# Patient Record
Sex: Male | Born: 1940
Health system: Southern US, Community
[De-identification: ages and names within clinical notes are randomized; demographics above are authoritative.]

## PROBLEM LIST (undated history)

## (undated) DIAGNOSIS — Z973 Presence of spectacles and contact lenses: Secondary | ICD-10-CM

## (undated) DIAGNOSIS — N400 Enlarged prostate without lower urinary tract symptoms: Secondary | ICD-10-CM

## (undated) DIAGNOSIS — I4821 Permanent atrial fibrillation: Secondary | ICD-10-CM

## (undated) DIAGNOSIS — E785 Hyperlipidemia, unspecified: Secondary | ICD-10-CM

## (undated) DIAGNOSIS — H9319 Tinnitus, unspecified ear: Secondary | ICD-10-CM

## (undated) DIAGNOSIS — Z9889 Other specified postprocedural states: Secondary | ICD-10-CM

## (undated) DIAGNOSIS — I480 Paroxysmal atrial fibrillation: Secondary | ICD-10-CM

## (undated) DIAGNOSIS — I10 Essential (primary) hypertension: Secondary | ICD-10-CM

## (undated) DIAGNOSIS — R112 Nausea with vomiting, unspecified: Secondary | ICD-10-CM

## (undated) DIAGNOSIS — E039 Hypothyroidism, unspecified: Secondary | ICD-10-CM

## (undated) DIAGNOSIS — E042 Nontoxic multinodular goiter: Secondary | ICD-10-CM

## (undated) HISTORY — PX: COLONOSCOPY: SHX174

## (undated) HISTORY — DX: Tinnitus, unspecified ear: H93.19

## (undated) HISTORY — DX: Nontoxic multinodular goiter: E04.2

## (undated) HISTORY — DX: Hypothyroidism, unspecified: E03.9

## (undated) HISTORY — DX: Benign prostatic hyperplasia without lower urinary tract symptoms: N40.0

## (undated) HISTORY — DX: Paroxysmal atrial fibrillation: I48.0

## (undated) HISTORY — DX: Hyperlipidemia, unspecified: E78.5

## (undated) HISTORY — DX: Permanent atrial fibrillation: I48.21

---
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
## 1989-07-25 HISTORY — PX: ACHILLES TENDON REPAIR: SUR1153

## 2002-03-30 ENCOUNTER — Emergency Department (HOSPITAL_COMMUNITY): Admission: EM | Admit: 2002-03-30 | Discharge: 2002-03-30 | Payer: Self-pay | Admitting: *Deleted

## 2002-04-26 ENCOUNTER — Encounter: Payer: Self-pay | Admitting: Emergency Medicine

## 2002-04-26 ENCOUNTER — Inpatient Hospital Stay (HOSPITAL_COMMUNITY): Admission: EM | Admit: 2002-04-26 | Discharge: 2002-04-27 | Payer: Self-pay | Admitting: Emergency Medicine

## 2002-07-25 HISTORY — PX: CATARACT EXTRACTION: SUR2

## 2003-04-29 ENCOUNTER — Ambulatory Visit (HOSPITAL_COMMUNITY): Admission: RE | Admit: 2003-04-29 | Discharge: 2003-04-29 | Payer: Self-pay | Admitting: Ophthalmology

## 2006-10-18 ENCOUNTER — Ambulatory Visit: Payer: Self-pay | Admitting: Gastroenterology

## 2006-11-01 ENCOUNTER — Ambulatory Visit: Payer: Self-pay | Admitting: Gastroenterology

## 2011-08-29 DIAGNOSIS — R972 Elevated prostate specific antigen [PSA]: Secondary | ICD-10-CM | POA: Diagnosis not present

## 2011-09-26 DIAGNOSIS — R972 Elevated prostate specific antigen [PSA]: Secondary | ICD-10-CM | POA: Diagnosis not present

## 2011-09-27 DIAGNOSIS — H35379 Puckering of macula, unspecified eye: Secondary | ICD-10-CM | POA: Diagnosis not present

## 2011-09-27 DIAGNOSIS — H35369 Drusen (degenerative) of macula, unspecified eye: Secondary | ICD-10-CM | POA: Diagnosis not present

## 2011-10-10 DIAGNOSIS — J0100 Acute maxillary sinusitis, unspecified: Secondary | ICD-10-CM | POA: Diagnosis not present

## 2011-10-10 DIAGNOSIS — J209 Acute bronchitis, unspecified: Secondary | ICD-10-CM | POA: Diagnosis not present

## 2012-01-06 DIAGNOSIS — E039 Hypothyroidism, unspecified: Secondary | ICD-10-CM | POA: Diagnosis not present

## 2012-01-06 DIAGNOSIS — Z125 Encounter for screening for malignant neoplasm of prostate: Secondary | ICD-10-CM | POA: Diagnosis not present

## 2012-01-06 DIAGNOSIS — R7301 Impaired fasting glucose: Secondary | ICD-10-CM | POA: Diagnosis not present

## 2012-01-13 ENCOUNTER — Encounter: Payer: Self-pay | Admitting: Internal Medicine

## 2012-01-13 DIAGNOSIS — R7301 Impaired fasting glucose: Secondary | ICD-10-CM | POA: Diagnosis not present

## 2012-01-13 DIAGNOSIS — Z0000 Encounter for general adult medical examination without abnormal findings: Secondary | ICD-10-CM | POA: Diagnosis not present

## 2012-01-13 DIAGNOSIS — I4891 Unspecified atrial fibrillation: Secondary | ICD-10-CM | POA: Diagnosis not present

## 2012-01-13 DIAGNOSIS — N400 Enlarged prostate without lower urinary tract symptoms: Secondary | ICD-10-CM | POA: Diagnosis not present

## 2012-01-16 ENCOUNTER — Ambulatory Visit (INDEPENDENT_AMBULATORY_CARE_PROVIDER_SITE_OTHER): Payer: Medicare Other | Admitting: Internal Medicine

## 2012-01-16 ENCOUNTER — Encounter: Payer: Self-pay | Admitting: Internal Medicine

## 2012-01-16 VITALS — BP 142/70 | HR 55 | Ht 69.5 in | Wt 181.0 lb

## 2012-01-16 DIAGNOSIS — I4891 Unspecified atrial fibrillation: Secondary | ICD-10-CM | POA: Insufficient documentation

## 2012-01-16 DIAGNOSIS — Z1212 Encounter for screening for malignant neoplasm of rectum: Secondary | ICD-10-CM | POA: Diagnosis not present

## 2012-01-16 NOTE — Patient Instructions (Addendum)
Your physician recommends that you schedule a follow-up appointment in: 2 MONTHS WITH DR Johney Frame  STOP PRADAXA  METOPROLOL ONCE DAILY AS NEEDED FOR ELEVATED HEART RATE  Your physician has requested that you have an echocardiogram. Echocardiography is a painless test that uses sound waves to create images of your heart. It provides your doctor with information about the size and shape of your heart and how well your heart's chambers and valves are working. This procedure takes approximately one hour. There are no restrictions for this procedure.

## 2012-01-16 NOTE — Assessment & Plan Note (Signed)
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  He is now back in sinus rhythm.  He is asymptomatic with his afib.  He did not tolerate Toprol due to sleepiness and fatigue.  Heart rates in afib appear to have been reasonably controlled. His CHADSVASC score is 1 (age >38) and CHADS2 score is 0.  His estimated annual risk of stroke is therefore <2%.  Per guidelines, he does not require anticoagulation at this point.  He was initiated on pradaxa by Dr Wylene Simmer in case we needed to proceed with cardioversion.  As he is now in sinus and does not require cardioversion, I will stop pradaxa.  We discussed risks and benefits of anticoagulation at length today.  The patient is clear that he would not want to continue anticoagulation at this point.  As he is asymptomatic with afib, he would also not be interested in antiarrhythmic medical therapy at this point. We will obtain an echo to evaluate for structural heart disease.  I will make no other changes today. He will return for follow-up in 8-12 weeks. He will call if problems arise in the interim.

## 2012-01-16 NOTE — Progress Notes (Signed)
Primary Care Physician: Dr Guerry Bruin   Jason Ali is a 71 y.o. male with a h/o hyperlipidemia and hypothyroidism who presents for cardiology consultation regarding recently discovered afib.  The patient is very active.  He presented to Dr Deneen Harts office last week for a routine physical.  He was found to have afib at that time with heart rates 50s-70s by ekg.  He was started on pradaxa in anticipation of possible cardioversion and placed on low dose toprol.  He reports feeling very "tired" with toprol.  He has not had any bleeding.  Today, he presents in sinus rhythm, he is unaware of any discrete changes in his activity level or energy since converting from afib.   He feels that his energy is always good.  He continues to work hard without physical limitation.    Today, he denies symptoms of palpitations, chest pain, shortness of breath, orthopnea, PND, lower extremity edema,  presyncope, syncope, or neurologic sequela. The patient is tolerating medications without difficulties and is otherwise without complaint today.   Past Medical History  Diagnosis Date  . Multinodular goiter   . Hyperlipidemia   . BPH (benign prostatic hyperplasia)   . Tinnitus   . Paroxysmal atrial fibrillation   . Hypothyroidism    Past Surgical History  Procedure Date  . Achilles tendon repair 1991  . Cataract extraction 2004    Current Outpatient Prescriptions  Medication Sig Dispense Refill  . Cholecalciferol (VITAMIN D3) 2000 UNITS TABS Take by mouth.      Marland Kitchen glucosamine-chondroitin 500-400 MG tablet Take 1 tablet by mouth 3 (three) times daily.      Marland Kitchen levothyroxine (SYNTHROID, LEVOTHROID) 125 MCG tablet Take 125 mcg by mouth daily.      . metoprolol succinate (TOPROL-XL) 25 MG 24 hr tablet Take 25 mg by mouth daily.      . Multiple Vitamin (MULTIVITAMIN) tablet Take 1 tablet by mouth daily.      Marland Kitchen omega-3 acid ethyl esters (LOVAZA) 1 G capsule Take 2 g by mouth 2 (two) times daily.      .  vitamin C (ASCORBIC ACID) 500 MG tablet Take 500 mg by mouth daily.        Allergies  Allergen Reactions  . Bee Venom   . Eggs Or Egg-Derived Products   . Latex     History   Social History  . Marital Status: Married    Spouse Name: N/A    Number of Children: N/A  . Years of Education: N/A   Occupational History  . Not on file.   Social History Main Topics  . Smoking status: Former Smoker    Quit date: 01/15/1965  . Smokeless tobacco: Not on file  . Alcohol Use: Yes     occasional red wine  . Drug Use: No  . Sexually Active: Not on file   Other Topics Concern  . 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  He does not snore.  Physical Exam: Filed Vitals:   01/16/12 0921  BP: 142/70  Pulse: 55  Height: 5' 9.5" (1.765 m)  Weight: 181 lb (82.101 kg)    GEN- The patient is well appearing, alert and oriented x 3 today.   Head- normocephalic, atraumatic Eyes-  Sclera clear, conjunctiva pink Ears-  hearing intact Oropharynx- clear Neck- supple, no JVP Lymph- no cervical lymphadenopathy Lungs- Clear to ausculation bilaterally, normal work of breathing Heart- Regular rate and rhythm, no murmurs, rubs or gallops, PMI not laterally displaced GI- soft, NT, ND, + BS Extremities- no clubbing, cyanosis, or edema MS- no significant deformity or atrophy Skin- no rash or lesion Psych- euthymic mood, full affect Neuro- strength and sensation are intact  EKG today reveals sinus bradycardia 55 bpm, LAA, otherwise normal ekg EKG from 01/13/12 is reviewed and reveals afib, V rate 71 bpm,   Assessment and Plan:

## 2012-01-17 ENCOUNTER — Encounter: Payer: Self-pay | Admitting: Cardiology

## 2012-01-24 ENCOUNTER — Ambulatory Visit (HOSPITAL_COMMUNITY): Payer: Medicare Other | Attending: Cardiology | Admitting: Radiology

## 2012-01-24 DIAGNOSIS — I4891 Unspecified atrial fibrillation: Secondary | ICD-10-CM | POA: Insufficient documentation

## 2012-01-24 DIAGNOSIS — I059 Rheumatic mitral valve disease, unspecified: Secondary | ICD-10-CM | POA: Diagnosis not present

## 2012-01-24 DIAGNOSIS — I517 Cardiomegaly: Secondary | ICD-10-CM | POA: Insufficient documentation

## 2012-01-24 DIAGNOSIS — E785 Hyperlipidemia, unspecified: Secondary | ICD-10-CM | POA: Insufficient documentation

## 2012-01-24 DIAGNOSIS — Z87891 Personal history of nicotine dependence: Secondary | ICD-10-CM | POA: Diagnosis not present

## 2012-01-24 NOTE — Progress Notes (Signed)
Echocardiogram performed.  

## 2012-01-27 ENCOUNTER — Telehealth: Payer: Self-pay | Admitting: *Deleted

## 2012-01-27 ENCOUNTER — Encounter: Payer: Self-pay | Admitting: Nurse Practitioner

## 2012-01-27 ENCOUNTER — Ambulatory Visit (INDEPENDENT_AMBULATORY_CARE_PROVIDER_SITE_OTHER): Payer: Medicare Other | Admitting: Nurse Practitioner

## 2012-01-27 VITALS — BP 125/53 | HR 55 | Ht 70.0 in | Wt 179.0 lb

## 2012-01-27 DIAGNOSIS — I4891 Unspecified atrial fibrillation: Secondary | ICD-10-CM | POA: Diagnosis not present

## 2012-01-27 DIAGNOSIS — I7789 Other specified disorders of arteries and arterioles: Secondary | ICD-10-CM | POA: Diagnosis not present

## 2012-01-27 DIAGNOSIS — I480 Paroxysmal atrial fibrillation: Secondary | ICD-10-CM

## 2012-01-27 DIAGNOSIS — R002 Palpitations: Secondary | ICD-10-CM | POA: Diagnosis not present

## 2012-01-27 MED ORDER — NEBIVOLOL HCL 5 MG PO TABS: 5.0000 mg | ORAL_TABLET | Freq: Every day | ORAL | Status: DC

## 2012-01-27 NOTE — Assessment & Plan Note (Signed)
He is in sinus today. Does have PAC's today on his EKG and has very frequent ectopics on exam. Did not tolerate Metoprolol. We will place an event monitor for the next month to assess his afib burden and look for SSS. His father had had a pacemaker. Will try some low dose Bystolic at 5 mg daily. He does not really think he should go on his trip and that's ok even though Dr. [place] and I both did not feel like he was in harms way. Will see him back in August as scheduled with Dr. Johney Frame. He is to minimize his caffeine and cut back on his alcohol as well. Patient is agreeable to this plan and will call if any problems develop in the interim.  Marland Kitchen

## 2012-01-27 NOTE — Assessment & Plan Note (Signed)
Mildly enlarged on echo. Will defer to Dr. Johney Frame regarding further management at his next OV.

## 2012-01-27 NOTE — Patient Instructions (Addendum)
We are going to put a heart monitor on for the next 4 weeks  We are going to try some Bystolic 5 mg a day. This should help with the palpitations and hopefully you will be able to tolerate better than the metoprolol  Cut back on the caffeine and alcohol  Watch the heat  Keep your appointment with Dr. Johney Frame in August.  Call the [facility] office at [phone] if you have any questions, problems or concerns.

## 2012-01-27 NOTE — Progress Notes (Signed)
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 He is seen for Dr. Johney Frame. He is a retired Associate Professor. He has had PAF. Was on Pradaxa but this was stopped at his OV with Dr. Johney Frame on June 24th. Did not tolerate Toprol due to feeling too sleepy and fatigued. His other problems include HLD, hypothyroidism and goiter. His CHADSVASC is 1 for age and CHADS2 is 0.   He comes in today. He is here alone. He walked in with the intention of giving his cell number so we could call him with his echo report. He then reported that he had been feeling light headed and dizzy. Has not felt all that great over the past week. Says he is "more aware of his heart". Does endorse palpitations and hard beating. He feels like he is going in and out of rhythm.  EKG was performed today and shows sinus with PAC's. He is getting ready to go out of town and was concerned. Did not know if he should go out of town. Does have some alcohol and caffeine use reported. No chest pain. Not short of breath. He is very active.   Current Outpatient Prescriptions on File Prior to Visit  Medication Sig Dispense Refill  . Cholecalciferol (VITAMIN D3) 2000 UNITS TABS Take by mouth.      Marland Kitchen glucosamine-chondroitin 500-400 MG tablet Take 1 tablet by mouth 3 (three) times daily.      Marland Kitchen levothyroxine (SYNTHROID, LEVOTHROID) 125 MCG tablet Take 125 mcg by mouth daily.      . Multiple Vitamin (MULTIVITAMIN) tablet Take 1 tablet by mouth daily.      Marland Kitchen omega-3 acid ethyl esters (LOVAZA) 1 G capsule Take 2 g by mouth 2 (two) times daily.      . vitamin C (ASCORBIC ACID) 500 MG tablet Take 500 mg by mouth daily.        Allergies  Allergen Reactions  . Bee Venom   . Eggs Or Egg-Derived Products   . Latex     Past Medical History  Diagnosis Date  . Multinodular goiter   . Hyperlipidemia   . BPH (benign prostatic hyperplasia)   . Tinnitus   .  Paroxysmal atrial fibrillation   . Hypothyroidism     Past Surgical History  Procedure Date  . Achilles tendon repair 1991  . Cataract extraction 2004    History  Smoking status  . Former Smoker  . Quit date: 01/15/1965  Smokeless tobacco  . Never Used    History  Alcohol Use  . Yes    occasional red wine    History reviewed. No pertinent family history.  Review of Systems: The review of systems is per the HPI.  All other systems were reviewed and are negative.  Physical Exam: BP 123/77  Pulse 64  Ht 5\' 10"  (1.778 m)  Wt 179 lb (81.194 kg)  BMI 25.68 kg/m2 Was not orthostatic.  Patient is very pleasant and in no acute distress. He is a little anxious. Skin is warm and dry. Color is normal.  HEENT is unremarkable. Normocephalic/atraumatic. PERRL. Sclera are nonicteric. Neck is supple. No masses. No JVD. Lungs are clear. Cardiac exam shows a regular rate and rhythm. He has frequent ectopics on physical exam noted. Abdomen is soft. Extremities are without edema. Gait and ROM are intact. No gross neurologic deficits noted.   LABORATORY DATA: EKG today  shows sinus rhythm with PAC's.   Echo Study Conclusions from January 24, 2012  - Left ventricle: The cavity size was normal. Wall thickness was increased in a pattern of mild LVH. The estimated ejection fraction was 55%. Wall motion was normal; there were no regional wall motion abnormalities. Doppler parameters are consistent with abnormal left ventricular relaxation (grade 1 diastolic dysfunction). - Aortic valve: There was no stenosis. - Aorta: Ascending aortic diameter: 40mm (S). - Ascending aorta: The ascending aorta was mildly dilated. - Mitral valve: Trivial regurgitation. - Left atrium: The atrium was mildly dilated. - Right ventricle: The cavity size was normal. Systolic function was normal. - Right atrium: The atrium was mildly dilated. - Atrial septum: Atrial septal aneurysm. - Pulmonary arteries: PA peak  pressure: 32mm Hg (S). - Inferior vena cava: The vessel was normal in size; the respirophasic diameter changes were in the normal range (= 50%); findings are consistent with normal central venous Pressure.  Impressions:  - Normal LV size and systolic function, EF 55%. Mild LV hypertrophy. Normal RV size and systolic function. No significant valvular abnormalities. Mild biatrial enlargement.     Assessment / Plan:

## 2012-01-27 NOTE — Telephone Encounter (Signed)
Pt walk in c/o feeling dizziness versus lite-headedness--pt states is going out of town and not sure if he should go--EKG done--also recent ECHO, SHARED WITH DR Swaziland (pcp) who states pt in sinus rhythm and as long as he is asymtomatic he may go out of town--EKG also shows PACs--pt advised of all above and we will try to get him in to see s weaver, the same day as dr allred here--pt agrees

## 2012-02-02 NOTE — Addendum Note (Signed)
Addended by: Micki Riley C on: 02/02/2012 01:41 PM   Modules accepted: Orders

## 2012-02-25 DIAGNOSIS — R002 Palpitations: Secondary | ICD-10-CM | POA: Diagnosis not present

## 2012-03-19 ENCOUNTER — Ambulatory Visit (INDEPENDENT_AMBULATORY_CARE_PROVIDER_SITE_OTHER): Payer: Medicare Other | Admitting: Internal Medicine

## 2012-03-19 ENCOUNTER — Encounter: Payer: Self-pay | Admitting: Internal Medicine

## 2012-03-19 VITALS — BP 131/79 | HR 57 | Ht 70.0 in | Wt 181.0 lb

## 2012-03-19 DIAGNOSIS — I4891 Unspecified atrial fibrillation: Secondary | ICD-10-CM | POA: Diagnosis not present

## 2012-03-19 MED ORDER — NEBIVOLOL HCL 5 MG PO TABS: 5.0000 mg | ORAL_TABLET | Freq: Every day | ORAL | Status: DC

## 2012-03-19 NOTE — Progress Notes (Signed)
PCP: Gaspar Garbe, MD  Jason Ali is a 71 y.o. male who presents today for routine electrophysiology followup.  Since last being seen in our clinic, the patient reports doing very well.  Today, he denies symptoms of palpitations, chest pain, shortness of breath,  lower extremity edema, dizziness, presyncope, or syncope.  The patient is otherwise without complaint today.   Past Medical History  Diagnosis Date  . Multinodular goiter   . Hyperlipidemia   . BPH (benign prostatic hyperplasia)   . Tinnitus   . Paroxysmal atrial fibrillation   . Hypothyroidism    Past Surgical History  Procedure Date  . Achilles tendon repair 1991  . Cataract extraction 2004    Current Outpatient Prescriptions  Medication Sig Dispense Refill  . Cholecalciferol (VITAMIN D3) 2000 UNITS TABS Take by mouth.      Marland Kitchen glucosamine-chondroitin 500-400 MG tablet Take 1 tablet by mouth 3 (three) times daily.      Marland Kitchen levothyroxine (SYNTHROID, LEVOTHROID) 125 MCG tablet Take 125 mcg by mouth daily.      . Multiple Vitamin (MULTIVITAMIN) tablet Take 1 tablet by mouth daily.      . nebivolol (BYSTOLIC) 5 MG tablet Take 1 tablet (5 mg total) by mouth daily.  30 tablet  6  . omega-3 acid ethyl esters (LOVAZA) 1 G capsule Take 2 g by mouth 2 (two) times daily.      . vitamin C (ASCORBIC ACID) 500 MG tablet Take 500 mg by mouth daily.        Physical Exam: Filed Vitals:   03/19/12 0942  BP: 131/79  Pulse: 57  Height: 5\' 10"  (1.778 m)  Weight: 181 lb (82.101 kg)    GEN- The patient is well appearing, alert and oriented x 3 today.   Head- normocephalic, atraumatic Eyes-  Sclera clear, conjunctiva pink Ears- hearing intact Oropharynx- clear Lungs- Clear to ausculation bilaterally, normal work of breathing Heart- Regular rate and rhythm, no murmurs, rubs or gallops, PMI not laterally displaced GI- soft, NT, ND, + BS Extremities- no clubbing, cyanosis, or edema  ekg today reveals sinus bradycardia 49 bpm,  PR 186, QRS 80, Qtc 383, otherwise normal ekg  Assessment and Plan:

## 2012-03-19 NOTE — Patient Instructions (Addendum)
Your physician wants you to follow-up in: 6 MONTHS WITH DR. ALLRED. You will receive a reminder letter in the mail two months in advance. If you don't receive a letter, please call our office to schedule the follow-up appointment.  

## 2012-03-19 NOTE — Assessment & Plan Note (Signed)
Asymptomatic CHADS2 score is 0.  He is therefore not anticoagulated.  Afib rates are well controlled with bystolic and he appears to be tolerating this reasonably well despite chronic bradycardia. I would have a low threshold to cut back to 2.5mg  if symptoms of bradycardia occur.

## 2012-04-10 DIAGNOSIS — H35379 Puckering of macula, unspecified eye: Secondary | ICD-10-CM | POA: Diagnosis not present

## 2012-04-10 DIAGNOSIS — Z961 Presence of intraocular lens: Secondary | ICD-10-CM | POA: Diagnosis not present

## 2012-04-10 DIAGNOSIS — H35369 Drusen (degenerative) of macula, unspecified eye: Secondary | ICD-10-CM | POA: Diagnosis not present

## 2012-06-17 ENCOUNTER — Encounter (HOSPITAL_COMMUNITY): Payer: Self-pay | Admitting: Emergency Medicine

## 2012-06-17 ENCOUNTER — Emergency Department (HOSPITAL_COMMUNITY)
Admission: EM | Admit: 2012-06-17 | Discharge: 2012-06-17 | Disposition: A | Discharge status: Home / Self Care | Payer: Medicare Other | Attending: Emergency Medicine | Admitting: Emergency Medicine

## 2012-06-17 ENCOUNTER — Emergency Department (HOSPITAL_COMMUNITY): Payer: Medicare Other

## 2012-06-17 DIAGNOSIS — Z87891 Personal history of nicotine dependence: Secondary | ICD-10-CM | POA: Diagnosis not present

## 2012-06-17 DIAGNOSIS — I4891 Unspecified atrial fibrillation: Secondary | ICD-10-CM | POA: Diagnosis not present

## 2012-06-17 DIAGNOSIS — E079 Disorder of thyroid, unspecified: Secondary | ICD-10-CM | POA: Insufficient documentation

## 2012-06-17 DIAGNOSIS — J209 Acute bronchitis, unspecified: Secondary | ICD-10-CM | POA: Diagnosis not present

## 2012-06-17 DIAGNOSIS — Z79899 Other long term (current) drug therapy: Secondary | ICD-10-CM | POA: Diagnosis not present

## 2012-06-17 DIAGNOSIS — R112 Nausea with vomiting, unspecified: Secondary | ICD-10-CM | POA: Insufficient documentation

## 2012-06-17 DIAGNOSIS — J3489 Other specified disorders of nose and nasal sinuses: Secondary | ICD-10-CM | POA: Diagnosis not present

## 2012-06-17 DIAGNOSIS — J329 Chronic sinusitis, unspecified: Secondary | ICD-10-CM | POA: Insufficient documentation

## 2012-06-17 DIAGNOSIS — J42 Unspecified chronic bronchitis: Secondary | ICD-10-CM | POA: Diagnosis not present

## 2012-06-17 DIAGNOSIS — J40 Bronchitis, not specified as acute or chronic: Secondary | ICD-10-CM | POA: Diagnosis not present

## 2012-06-17 DIAGNOSIS — N400 Enlarged prostate without lower urinary tract symptoms: Secondary | ICD-10-CM | POA: Insufficient documentation

## 2012-06-17 DIAGNOSIS — E785 Hyperlipidemia, unspecified: Secondary | ICD-10-CM | POA: Diagnosis not present

## 2012-06-17 DIAGNOSIS — J019 Acute sinusitis, unspecified: Secondary | ICD-10-CM | POA: Diagnosis not present

## 2012-06-17 LAB — URINALYSIS, ROUTINE W REFLEX MICROSCOPIC
Bilirubin Urine: NEGATIVE
Glucose, UA: NEGATIVE mg/dL
Hgb urine dipstick: NEGATIVE
Ketones, ur: NEGATIVE mg/dL
Leukocytes, UA: NEGATIVE
Nitrite: NEGATIVE
Protein, ur: NEGATIVE mg/dL
Specific Gravity, Urine: 1.02 (ref 1.005–1.030)
Urobilinogen, UA: 0.2 mg/dL (ref 0.0–1.0)
pH: 6 (ref 5.0–8.0)

## 2012-06-17 LAB — COMPREHENSIVE METABOLIC PANEL
ALT: 15 U/L (ref 0–53)
AST: 19 U/L (ref 0–37)
Albumin: 3.7 g/dL (ref 3.5–5.2)
Alkaline Phosphatase: 50 U/L (ref 39–117)
BUN: 15 mg/dL (ref 6–23)
CO2: 27 mEq/L (ref 19–32)
Calcium: 9.4 mg/dL (ref 8.4–10.5)
Chloride: 101 mEq/L (ref 96–112)
Creatinine, Ser: 0.95 mg/dL (ref 0.50–1.35)
GFR calc Af Amer: >90 mL/min (ref >90)
GFR calc non Af Amer: 81 mL/min — ABNORMAL LOW (ref >90)
Glucose, Bld: 124 mg/dL — ABNORMAL HIGH (ref 70–99)
Potassium: 3.4 mEq/L — ABNORMAL LOW (ref 3.5–5.1)
Sodium: 138 mEq/L (ref 135–145)
Total Bilirubin: 0.6 mg/dL (ref 0.3–1.2)
Total Protein: 7.1 g/dL (ref 6.0–8.3)

## 2012-06-17 LAB — CBC
HCT: 40.5 % (ref 39.0–52.0)
Hemoglobin: 14 g/dL (ref 13.0–17.0)
MCH: 31.2 pg (ref 26.0–34.0)
MCHC: 34.6 g/dL (ref 30.0–36.0)
MCV: 90.2 fL (ref 78.0–100.0)
Platelets: 232 10*3/uL (ref 150–400)
RBC: 4.49 MIL/uL (ref 4.22–5.81)
RDW: 12.8 % (ref 11.5–15.5)
WBC: 16.6 10*3/uL — ABNORMAL HIGH (ref 4.0–10.5)

## 2012-06-17 LAB — POCT I-STAT, CHEM 8
BUN: 15 mg/dL (ref 6–23)
Calcium, Ion: 1.15 mmol/L (ref 1.13–1.30)
Chloride: 105 mEq/L (ref 96–112)
Creatinine, Ser: 1.1 mg/dL (ref 0.50–1.35)
Glucose, Bld: 127 mg/dL — ABNORMAL HIGH (ref 70–99)
HCT: 41 % (ref 39.0–52.0)
Hemoglobin: 13.9 g/dL (ref 13.0–17.0)
Potassium: 3.6 mEq/L (ref 3.5–5.1)
Sodium: 141 mEq/L (ref 135–145)
TCO2: 23 mmol/L (ref 0–100)

## 2012-06-17 LAB — LACTIC ACID, PLASMA: Lactic Acid, Venous: 0.8 mmol/L (ref 0.5–2.2)

## 2012-06-17 MED ORDER — SODIUM CHLORIDE 0.9 % IV BOLUS (SEPSIS)
1000.0000 mL | Freq: Once | INTRAVENOUS | Status: AC
  Administered 2012-06-17: 1000 mL via INTRAVENOUS

## 2012-06-17 MED ORDER — ONDANSETRON 4 MG PO TBDP: 4.0000 mg | ORAL_TABLET | Freq: Three times a day (TID) | ORAL | Status: DC | PRN

## 2012-06-17 MED ORDER — SODIUM CHLORIDE 0.9 % IV SOLN: INTRAVENOUS | Status: DC

## 2012-06-17 MED ORDER — ALBUTEROL SULFATE (5 MG/ML) 0.5% IN NEBU
2.5000 mg | INHALATION_SOLUTION | Freq: Once | RESPIRATORY_TRACT | Status: AC
  Administered 2012-06-17: 2.5 mg via RESPIRATORY_TRACT
  Filled 2012-06-17: qty 0.5

## 2012-06-17 MED ORDER — ACETAMINOPHEN 325 MG PO TABS
650.0000 mg | ORAL_TABLET | Freq: Once | ORAL | Status: AC
  Administered 2012-06-17: 650 mg via ORAL
  Filled 2012-06-17: qty 2

## 2012-06-17 MED ORDER — AZITHROMYCIN 250 MG PO TABS: 250.0000 mg | ORAL_TABLET | Freq: Every day | ORAL | Status: DC

## 2012-06-17 MED ORDER — ONDANSETRON HCL 4 MG/2ML IJ SOLN
4.0000 mg | Freq: Once | INTRAMUSCULAR | Status: AC
  Administered 2012-06-17: 4 mg via INTRAVENOUS
  Filled 2012-06-17: qty 2

## 2012-06-17 MED ORDER — ALBUTEROL SULFATE HFA 108 (90 BASE) MCG/ACT IN AERS
1.0000 | INHALATION_SPRAY | Freq: Four times a day (QID) | RESPIRATORY_TRACT | Status: DC | PRN
Start: 2012-06-17 — End: 2012-08-31

## 2012-06-17 NOTE — ED Notes (Signed)
Pt from home with c/o sinus pain, drainage, nausea and headache x 1 week. Patient sts that he is having yellow sputum drainage, coughing, denies chest pain.

## 2012-06-17 NOTE — ED Notes (Signed)
Vital signs placed at 0140 by Donell Sievert RN  Is not for this pt.  Corrected to pt

## 2012-06-17 NOTE — ED Notes (Signed)
States copious amounts of "serum" out of nose and he has used Afrin selectively to help but drainage is "unreal"

## 2012-06-17 NOTE — ED Notes (Signed)
Pt states he had a fever last Saturday  After returning from a retreat [facility] [employer].  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

## 2012-06-17 NOTE — ED Provider Notes (Signed)
History     CSN: [id number]  Arrival date & time 06/17/12  0130   First MD Initiated Contact with Patient 06/17/12 0200      Chief Complaint  Patient presents with  . Fever  . Nausea    (Consider location/radiation/quality/duration/timing/severity/associated sxs/prior treatment) HPI Comments: Patient is a 71 year old male who reports a past medical history of frequent sinusitis who presents with a 10 day history of fever, productive cough, nausea and general malaise. Patient reports symptoms started gradually and progressively worsened since the onset. Patient has tried home remedies for symptoms without relief. Symptoms have been constant. No aggravating/alleviating factors. Associated symptoms include sinus congestion. Cough production includes yellow/green phlegm. No sick contacts.    Past Medical History  Diagnosis Date  . Multinodular goiter   . Hyperlipidemia   . BPH (benign prostatic hyperplasia)   . Tinnitus   . Paroxysmal atrial fibrillation   . Hypothyroidism     Past Surgical History  Procedure Date  . Achilles tendon repair 1991  . Cataract extraction 2004    History reviewed. No pertinent family history.  History  Substance Use Topics  . Smoking status: Former Smoker    Quit date: 01/15/1965  . Smokeless tobacco: Never Used  . Alcohol Use: Yes     Comment: occasional red wine 3 to 4 x per week      Review of Systems  Constitutional: Positive for fever.  HENT: Positive for congestion, rhinorrhea and sinus pressure.   Respiratory: Positive for cough.   Gastrointestinal: Positive for nausea and vomiting.  All other systems reviewed and are negative.    Allergies  Bee venom; Eggs or egg-derived products; and Latex  Home Medications   Current Outpatient Rx  Name  Route  Sig  Dispense  Refill  . VITAMIN D3 2000 UNITS PO TABS   Oral   Take by mouth.         Marland Kitchen GLUCOSAMINE-CHONDROITIN 500-400 MG PO TABS   Oral   Take 1 tablet by mouth 3  (three) times daily.         . GUAIFENESIN ER 600 MG PO TB12   Oral   Take 1,200 mg by mouth 2 (two) times daily.         Marland Kitchen LEVOTHYROXINE SODIUM 125 MCG PO TABS   Oral   Take 125 mcg by mouth daily.         Marland Kitchen ONE-DAILY MULTI VITAMINS PO TABS   Oral   Take 1 tablet by mouth daily.         . NEBIVOLOL HCL 5 MG PO TABS   Oral   Take 1 tablet (5 mg total) by mouth daily.   90 tablet   4   . OMEGA-3-ACID ETHYL ESTERS 1 G PO CAPS   Oral   Take 2 g by mouth 2 (two) times daily.         Marland Kitchen OXYMETAZOLINE HCL 0.05 % NA SOLN   Nasal   Place 2 sprays into the nose 2 (two) times daily.         Marland Kitchen VITAMIN C 500 MG PO TABS   Oral   Take 500 mg by mouth daily.           BP 147/83  Pulse 109  Temp 98.7 F (37.1 C) (Oral)  Resp 22  Ht 5\' 11"  (1.803 m)  Wt 180 lb (81.647 kg)  BMI 25.10 kg/m2  SpO2 98%  Physical Exam  Nursing note and  vitals reviewed. Constitutional: He is oriented to person, place, and time. He appears well-developed and well-nourished. No distress.  HENT:  Head: Normocephalic and atraumatic.  Nose: Nose normal.  Mouth/Throat: Oropharynx is clear and moist. No oropharyngeal exudate.       Maxillary sinus tenderness to palpation.   Eyes: Conjunctivae normal and EOM are normal. Pupils are equal, round, and reactive to light. No scleral icterus.  Neck: Normal range of motion. Neck supple.  Cardiovascular: Normal rate and regular rhythm.  Exam reveals no gallop and no friction rub.   No murmur heard. Pulmonary/Chest: Effort normal. He has wheezes. He has no rales. He exhibits no tenderness.       Bilateral wheezing noted at lung bases.   Abdominal: Soft. He exhibits no distension. There is no tenderness. There is no rebound and no guarding.  Musculoskeletal: Normal range of motion.  Neurological: He is alert and oriented to person, place, and time. Coordination normal.       Speech is goal-oriented. Moves limbs without ataxia.   Skin: Skin is warm  and dry. He is not diaphoretic.  Psychiatric: He has a normal mood and affect. His behavior is normal.    ED Course  Procedures (including critical care time)  Labs Reviewed  CBC - Abnormal; Notable for the following:    WBC 16.6 (*)     All other components within normal limits  COMPREHENSIVE METABOLIC PANEL - Abnormal; Notable for the following:    Potassium 3.4 (*)     Glucose, Bld 124 (*)     GFR calc non Af Amer 81 (*)     All other components within normal limits  POCT I-STAT, CHEM 8 - Abnormal; Notable for the following:    Glucose, Bld 127 (*)     All other components within normal limits  URINALYSIS, ROUTINE W REFLEX MICROSCOPIC  LACTIC ACID, PLASMA   Dg Chest 2 View  06/17/2012  *RADIOLOGY REPORT*  Clinical Data: Fever, cough, congestion, nausea.  CHEST - 2 VIEW  Comparison: None.  Findings: Normal heart size and pulmonary vascularity.  Mild peribronchial thickening and interstitial changes suggesting chronic bronchitis.  No focal airspace consolidation in the lungs. No blunting of costophrenic angles.  No pneumothorax.  Mediastinal contours appear intact.  Tortuous aorta.  IMPRESSION: Chronic bronchitic changes.  No evidence of active pulmonary disease.   Original Report Authenticated By: Burman Nieves, M.D.      1. Bronchitis   2. Sinusitis       MDM  2:15 AM Labs, chest xray, urinalysis pending. Patient will have fluids, zofran, and nebulizer treatment for symptoms.   4:38 AM Chest xray concerning for bronchitis. Patient feels much better after nebulizer, fluids and zofran. Labs unremarkable. Patient will be discharged with Azithromycin, albuterol inhaler, and zofran for nausea. Patient instructed to return with worsening or concerning symptoms. No further evaluation needed at this time.      Emilia Beck, PA-C 06/17/12 [phone]

## 2012-06-17 NOTE — ED Provider Notes (Signed)
Medical screening examination/treatment/procedure(s) were performed by non-physician practitioner and as supervising physician I was immediately available for consultation/collaboration.  Sunnie Nielsen, MD 06/17/12 [phone]

## 2012-06-17 NOTE — ED Notes (Signed)
RT at bedside for neb tx

## 2012-07-31 DIAGNOSIS — H35369 Drusen (degenerative) of macula, unspecified eye: Secondary | ICD-10-CM | POA: Diagnosis not present

## 2012-07-31 DIAGNOSIS — H35379 Puckering of macula, unspecified eye: Secondary | ICD-10-CM | POA: Diagnosis not present

## 2012-07-31 DIAGNOSIS — Z961 Presence of intraocular lens: Secondary | ICD-10-CM | POA: Diagnosis not present

## 2012-08-17 ENCOUNTER — Encounter: Payer: Self-pay | Admitting: Gastroenterology

## 2012-08-29 ENCOUNTER — Ambulatory Visit: Payer: Medicare Other | Admitting: Internal Medicine

## 2012-08-31 ENCOUNTER — Ambulatory Visit (INDEPENDENT_AMBULATORY_CARE_PROVIDER_SITE_OTHER): Payer: Medicare Other | Admitting: Internal Medicine

## 2012-08-31 ENCOUNTER — Encounter: Payer: Self-pay | Admitting: Internal Medicine

## 2012-08-31 VITALS — BP 127/76 | HR 50 | Ht 70.0 in | Wt 183.4 lb

## 2012-08-31 DIAGNOSIS — I4891 Unspecified atrial fibrillation: Secondary | ICD-10-CM | POA: Diagnosis not present

## 2012-08-31 NOTE — Patient Instructions (Addendum)
Your physician wants you to follow-up in: 6 months with Dr. Allred. You will receive a reminder letter in the mail two months in advance. If you don't receive a letter, please call our office to schedule the follow-up appointment.  

## 2012-09-02 NOTE — Progress Notes (Signed)
PCP: Gaspar Garbe, MD  Jason Ali is a 72 y.o. male who presents today for routine electrophysiology followup.  Since last being seen in our clinic, the patient reports doing very well.  Today, he denies symptoms of palpitations, chest pain, shortness of breath,  lower extremity edema, dizziness, presyncope, or syncope.  The patient is otherwise without complaint today.   Past Medical History  Diagnosis Date  . Multinodular goiter   . Hyperlipidemia   . BPH (benign prostatic hyperplasia)   . Tinnitus   . Paroxysmal atrial fibrillation   . Hypothyroidism    Past Surgical History  Procedure Laterality Date  . Achilles tendon repair  1991  . Cataract extraction  2004    Current Outpatient Prescriptions  Medication Sig Dispense Refill  . Cholecalciferol (VITAMIN D3) 2000 UNITS TABS Take by mouth.      Marland Kitchen glucosamine-chondroitin 500-400 MG tablet Take 1 tablet by mouth 3 (three) times daily.      Marland Kitchen levothyroxine (SYNTHROID, LEVOTHROID) 125 MCG tablet Take 125 mcg by mouth daily.      . Multiple Vitamin (MULTIVITAMIN) tablet Take 1 tablet by mouth daily.      . nebivolol (BYSTOLIC) 5 MG tablet Take 1 tablet (5 mg total) by mouth daily.  90 tablet  4  . omega-3 acid ethyl esters (LOVAZA) 1 G capsule Take 2 g by mouth 2 (two) times daily.      . vitamin C (ASCORBIC ACID) 500 MG tablet Take 500 mg by mouth daily.       No current facility-administered medications for this visit.    Physical Exam: Filed Vitals:   08/31/12 0917  BP: 127/76  Pulse: 50  Height: 5\' 10"  (1.778 m)  Weight: 183 lb 6.4 oz (83.19 kg)    GEN- The patient is well appearing, alert and oriented x 3 today.   Head- normocephalic, atraumatic Eyes-  Sclera clear, conjunctiva pink Ears- hearing intact Oropharynx- clear Lungs- Clear to ausculation bilaterally, normal work of breathing Heart- Regular rate and rhythm, no murmurs, rubs or gallops, PMI not laterally displaced GI- soft, NT, ND, +  BS Extremities- no clubbing, cyanosis, or edema  ekg today reveals sinus bradycardia 48 bpm, PR 186, QRS 82, Qtc 384, otherwise normal ekg  Assessment and Plan:  1. afib Doing very well with rate control CHADS2 score is 0.  He takes only asa at this time No changes today  Return in 6 months We should probably repeat an echo then  2. HL Per Dr Wylene Simmer.

## 2012-10-19 ENCOUNTER — Encounter: Payer: Self-pay | Admitting: Gastroenterology

## 2012-10-22 DIAGNOSIS — R972 Elevated prostate specific antigen [PSA]: Secondary | ICD-10-CM | POA: Diagnosis not present

## 2012-11-19 ENCOUNTER — Ambulatory Visit (AMBULATORY_SURGERY_CENTER): Payer: Medicare Other | Admitting: *Deleted

## 2012-11-19 VITALS — Ht 70.0 in | Wt 181.0 lb

## 2012-11-19 DIAGNOSIS — Z1211 Encounter for screening for malignant neoplasm of colon: Secondary | ICD-10-CM

## 2012-11-19 MED ORDER — MOVIPREP 100 G PO SOLR: ORAL | Status: DC

## 2012-11-27 DIAGNOSIS — H35369 Drusen (degenerative) of macula, unspecified eye: Secondary | ICD-10-CM | POA: Diagnosis not present

## 2012-11-27 DIAGNOSIS — Z961 Presence of intraocular lens: Secondary | ICD-10-CM | POA: Diagnosis not present

## 2012-11-27 DIAGNOSIS — H35379 Puckering of macula, unspecified eye: Secondary | ICD-10-CM | POA: Diagnosis not present

## 2012-12-03 ENCOUNTER — Encounter: Payer: Self-pay | Admitting: Gastroenterology

## 2012-12-03 ENCOUNTER — Ambulatory Visit (AMBULATORY_SURGERY_CENTER): Payer: Medicare Other | Admitting: Gastroenterology

## 2012-12-03 ENCOUNTER — Other Ambulatory Visit: Payer: Self-pay | Admitting: *Deleted

## 2012-12-03 VITALS — BP 106/68 | HR 44 | Temp 97.0°F | Resp 34 | Ht 70.0 in | Wt 181.0 lb

## 2012-12-03 DIAGNOSIS — D126 Benign neoplasm of colon, unspecified: Secondary | ICD-10-CM

## 2012-12-03 DIAGNOSIS — Z1211 Encounter for screening for malignant neoplasm of colon: Secondary | ICD-10-CM | POA: Diagnosis not present

## 2012-12-03 DIAGNOSIS — Z800 Family history of malignant neoplasm of digestive organs: Secondary | ICD-10-CM

## 2012-12-03 DIAGNOSIS — K620 Anal polyp: Secondary | ICD-10-CM | POA: Diagnosis not present

## 2012-12-03 DIAGNOSIS — K621 Rectal polyp: Secondary | ICD-10-CM | POA: Diagnosis not present

## 2012-12-03 DIAGNOSIS — K5730 Diverticulosis of large intestine without perforation or abscess without bleeding: Secondary | ICD-10-CM

## 2012-12-03 DIAGNOSIS — I4891 Unspecified atrial fibrillation: Secondary | ICD-10-CM | POA: Diagnosis not present

## 2012-12-03 MED ORDER — SODIUM CHLORIDE 0.9 % IV SOLN: 500.0000 mL | INTRAVENOUS | Status: DC

## 2012-12-03 NOTE — Progress Notes (Signed)
Patient did not have preoperative order for IV antibiotic SSI prophylaxis. (G8918)Patient did not experience any of the following events: a burn prior to discharge; a fall within the facility; wrong site/side/patient/procedure/implant event; or a hospital transfer or hospital admission upon discharge from the facility. (G8907)Patient did not have preoperative order for IV antibiotic SSI prophylaxis. ([id number])Patient did not have preoperative order for IV antibiotic SSI prophylaxis. [phone])

## 2012-12-03 NOTE — Op Note (Addendum)
[facility] [address] [place], [address]   COLONOSCOPY PROCEDURE REPORT  PATIENT: Jason Ali, Jason Ali  MR#: [id number] BIRTHDATE: 1940-11-16 , 72  yrs. old GENDER: Male ENDOSCOPIST: Mardella Layman, MD, [facility] REFERRED BY: PROCEDURE DATE:  12/03/2012 PROCEDURE:   Colonoscopy with biopsy ASA CLASS:   Class II INDICATIONS:Patient's personal history of adenomatous colon polyps and Patient's immediate family history of colon cancer. MEDICATIONS:NL 150 mcg IV and Versed 2 mg IV IV  DESCRIPTION OF PROCEDURE:   After the risks and benefits and of the procedure were explained, informed consent was obtained.  A digital rectal exam revealed no abnormalities of the rectum.    The LB [id number]  endoscope was introduced through the anus and advanced to the cecum, which was identified by both the appendix and ileocecal valve .  The quality of the prep was excellent, using MoviPrep .  The instrument was then slowly withdrawn as the colon was fully examined.     COLON FINDINGS: Mild diverticulosis was noted in the sigmoid colon. Three diminutive smooth flat polyps were found in the rectum. Multiple biopsies were performed using cold forceps.   The colon was otherwise normal.  There was no diverticulosis, inflammation, polyps or cancers unless previously stated.     Retroflexed views revealed no abnormalities.     The scope was then withdrawn from the patient and the procedure completed.  COMPLICATIONS: There were no complications. ENDOSCOPIC IMPRESSION: 1.   Mild diverticulosis was noted in the sigmoid colon 2.   Three diminutive flat polyps were found in the rectum; multiple biopsies were performed using cold forceps ..probable hyperplastic polyps 3.   The colon was otherwise normal ..+ FH colon Ca in father age 12.  RECOMMENDATIONS: 1.  Await pathology results 2.  Continue current medications 3.  Given your significant family history of colon cancer,  you should have a repeat colonoscopy in 5 years   REPEAT EXAM:  ccy] Dr. Gerlene Burdock Tiscovec  _______________________________ eSigned:  Mardella Layman, MD, [facility] 12/03/2012 10:53 AM Revised: 12/03/2012 10:53 AM

## 2012-12-03 NOTE — Patient Instructions (Addendum)
YOU HAD AN ENDOSCOPIC PROCEDURE TODAY AT THE [facility]: Refer to the procedure report that was given to you for any specific questions about what was found during the examination.  If the procedure report does not answer your questions, please call your gastroenterologist to clarify.  If you requested that your care partner not be given the details of your procedure findings, then the procedure report has been included in a sealed envelope for you to review at your convenience later.  YOU SHOULD EXPECT: Some feelings of bloating in the abdomen. Passage of more gas than usual.  Walking can help get rid of the air that was put into your GI tract during the procedure and reduce the bloating. If you had a lower endoscopy (such as a colonoscopy or flexible sigmoidoscopy) you may notice spotting of blood in your stool or on the toilet paper. If you underwent a bowel prep for your procedure, then you may not have a normal bowel movement for a few days.  DIET: Your first meal following the procedure should be a light meal and then it is ok to progress to your normal diet.  A half-sandwich or bowl of soup is an example of a good first meal.  Heavy or fried foods are harder to digest and may make you feel nauseous or bloated.  Likewise meals heavy in dairy and vegetables can cause extra gas to form and this can also increase the bloating.  Drink plenty of fluids but you should avoid alcoholic beverages for 24 hours.  ACTIVITY: Your care partner should take you home directly after the procedure.  You should plan to take it easy, moving slowly for the rest of the day.  You can resume normal activity the day after the procedure however you should NOT DRIVE or use heavy machinery for 24 hours (because of the sedation medicines used during the test).    SYMPTOMS TO REPORT IMMEDIATELY: A gastroenterologist can be reached at any hour.  During normal business hours, 8:30 AM to 5:00 PM Monday through Friday,  call [phone].  After hours and on weekends, please call the GI answering service at [phone] who will take a message and have the physician on call contact you.   Following lower endoscopy (colonoscopy or flexible sigmoidoscopy):  Excessive amounts of blood in the stool  Significant tenderness or worsening of abdominal pains  Swelling of the abdomen that is new, acute  Fever of 100F or higher   FOLLOW UP: If any biopsies were taken you will be contacted by phone or by letter within the next 1-3 weeks.  Call your gastroenterologist if you have not heard about the biopsies in 3 weeks.  Our staff will call the home number listed on your records the next business day following your procedure to check on you and address any questions or concerns that you may have at that time regarding the information given to you following your procedure. This is a courtesy call and so if there is no answer at the home number and we have not heard from you through the emergency physician on call, we will assume that you have returned to your regular daily activities without incident.  SIGNATURES/CONFIDENTIALITY: You and/or your care partner have signed paperwork which will be entered into your electronic medical record.  These signatures attest to the fact that that the information above on your After Visit Summary has been reviewed and is understood.  Full responsibility of the confidentiality of   this discharge information lies with you and/or your care-partner.   Information on polyps,diverticulosis,and high fiber diet given to you today 

## 2012-12-03 NOTE — Progress Notes (Signed)
Called to room to assist during endoscopic procedure.  Patient ID and intended procedure confirmed with present staff. Received instructions for my participation in the procedure from the performing physician.  

## 2012-12-04 ENCOUNTER — Telehealth: Payer: Self-pay | Admitting: *Deleted

## 2012-12-04 NOTE — Telephone Encounter (Signed)
  Follow up Call-  Call back number 12/03/2012  Post procedure Call Back phone  # [phone]  Permission to leave phone message Yes     Patient questions:  Do you have a fever, pain , or abdominal swelling? no Pain Score  0 *  Have you tolerated food without any problems? yes  Have you been able to return to your normal activities? yes  Do you have any questions about your discharge instructions: Diet   no Medications  no Follow up visit  no  Do you have questions or concerns about your Care? no  Actions: * If pain score is 4 or above: No action needed, pain <4.

## 2012-12-12 ENCOUNTER — Encounter: Payer: Self-pay | Admitting: Gastroenterology

## 2013-01-10 DIAGNOSIS — Z125 Encounter for screening for malignant neoplasm of prostate: Secondary | ICD-10-CM | POA: Diagnosis not present

## 2013-01-10 DIAGNOSIS — I4891 Unspecified atrial fibrillation: Secondary | ICD-10-CM | POA: Diagnosis not present

## 2013-01-10 DIAGNOSIS — E039 Hypothyroidism, unspecified: Secondary | ICD-10-CM | POA: Diagnosis not present

## 2013-01-14 DIAGNOSIS — L578 Other skin changes due to chronic exposure to nonionizing radiation: Secondary | ICD-10-CM | POA: Diagnosis not present

## 2013-01-14 DIAGNOSIS — L570 Actinic keratosis: Secondary | ICD-10-CM | POA: Diagnosis not present

## 2013-01-14 DIAGNOSIS — L905 Scar conditions and fibrosis of skin: Secondary | ICD-10-CM | POA: Diagnosis not present

## 2013-01-14 DIAGNOSIS — D485 Neoplasm of uncertain behavior of skin: Secondary | ICD-10-CM | POA: Diagnosis not present

## 2013-01-16 DIAGNOSIS — Z1331 Encounter for screening for depression: Secondary | ICD-10-CM | POA: Diagnosis not present

## 2013-01-16 DIAGNOSIS — E039 Hypothyroidism, unspecified: Secondary | ICD-10-CM | POA: Diagnosis not present

## 2013-01-16 DIAGNOSIS — I4891 Unspecified atrial fibrillation: Secondary | ICD-10-CM | POA: Diagnosis not present

## 2013-01-16 DIAGNOSIS — Z0000 Encounter for general adult medical examination without abnormal findings: Secondary | ICD-10-CM | POA: Diagnosis not present

## 2013-01-16 DIAGNOSIS — N400 Enlarged prostate without lower urinary tract symptoms: Secondary | ICD-10-CM | POA: Diagnosis not present

## 2013-01-16 DIAGNOSIS — Z6826 Body mass index (BMI) 26.0-26.9, adult: Secondary | ICD-10-CM | POA: Diagnosis not present

## 2013-01-16 DIAGNOSIS — H6120 Impacted cerumen, unspecified ear: Secondary | ICD-10-CM | POA: Diagnosis not present

## 2013-01-22 DIAGNOSIS — Z1212 Encounter for screening for malignant neoplasm of rectum: Secondary | ICD-10-CM | POA: Diagnosis not present

## 2013-02-27 ENCOUNTER — Encounter: Payer: Self-pay | Admitting: Internal Medicine

## 2013-02-27 ENCOUNTER — Ambulatory Visit (INDEPENDENT_AMBULATORY_CARE_PROVIDER_SITE_OTHER): Payer: Medicare Other | Admitting: Internal Medicine

## 2013-02-27 VITALS — BP 137/75 | HR 52 | Ht 70.0 in | Wt 180.2 lb

## 2013-02-27 DIAGNOSIS — I4891 Unspecified atrial fibrillation: Secondary | ICD-10-CM

## 2013-02-27 NOTE — Patient Instructions (Signed)
Your physician wants you to follow-up in: 12 months with Dr Allred You will receive a reminder letter in the mail two months in advance. If you don't receive a letter, please call our office to schedule the follow-up appointment.   Your physician has requested that you have an echocardiogram. Echocardiography is a painless test that uses sound waves to create images of your heart. It provides your doctor with information about the size and shape of your heart and how well your heart's chambers and valves are working. This procedure takes approximately one hour. There are no restrictions for this procedure.     

## 2013-02-27 NOTE — Progress Notes (Signed)
PCP: Gaspar Garbe, MD  Jason Ali is a 72 y.o. male who presents today for routine electrophysiology followup.  Since last being seen in our clinic, the patient reports doing very well.  Today, he denies symptoms of palpitations, chest pain, shortness of breath,  lower extremity edema, dizziness, presyncope, or syncope.  The patient is otherwise without complaint today.   Past Medical History  Diagnosis Date  . Multinodular goiter   . Hyperlipidemia   . BPH (benign prostatic hyperplasia)   . Tinnitus   . Paroxysmal atrial fibrillation   . Hypothyroidism    Past Surgical History  Procedure Laterality Date  . Achilles tendon repair  1991  . Cataract extraction  2004  . Tonsillectomy and adenoidectomy  1943    at age 65 and repeat at age 84    Current Outpatient Prescriptions  Medication Sig Dispense Refill  . aspirin 81 MG tablet Take 81 mg by mouth daily.      . Cholecalciferol (VITAMIN D3) 2000 UNITS TABS Take by mouth.      Marland Kitchen glucosamine-chondroitin 500-400 MG tablet Take 1 tablet by mouth 3 (three) times daily.      Marland Kitchen levothyroxine (SYNTHROID, LEVOTHROID) 125 MCG tablet Take 125 mcg by mouth daily.      . Multiple Vitamin (MULTIVITAMIN) tablet Take 1 tablet by mouth daily.      . nebivolol (BYSTOLIC) 5 MG tablet Take 1 tablet (5 mg total) by mouth daily.  90 tablet  4  . omega-3 acid ethyl esters (LOVAZA) 1 G capsule Take 2 g by mouth 2 (two) times daily.      . vitamin C (ASCORBIC ACID) 500 MG tablet Take 500 mg by mouth daily.       No current facility-administered medications for this visit.    Physical Exam: Filed Vitals:   02/27/13 0802  BP: 137/75  Pulse: 52  Height: 5\' 10"  (1.778 m)  Weight: 180 lb 3.2 oz (81.738 kg)    GEN- The patient is well appearing, alert and oriented x 3 today.   Head- normocephalic, atraumatic Eyes-  Sclera clear, conjunctiva pink Ears- hearing intact Oropharynx- clear Lungs- Clear to ausculation bilaterally, normal work of  breathing Heart- Regular rate and rhythm, no murmurs, rubs or gallops, PMI not laterally displaced GI- soft, NT, ND, + BS Extremities- no clubbing, cyanosis, or edema  ekg today reveals sinus bradycardia49 bpm  Assessment and Plan:  1. afib Doing very well with rate control CHADS2 score is 0.  He takes only asa at this time No changes today Echo to evaluate for structural changes related to afib  2. HL Per Dr Wylene Simmer.  Return in 1 year

## 2013-03-05 ENCOUNTER — Ambulatory Visit (HOSPITAL_COMMUNITY): Payer: Medicare Other | Attending: Cardiology | Admitting: Radiology

## 2013-03-05 ENCOUNTER — Other Ambulatory Visit: Payer: Self-pay

## 2013-03-05 DIAGNOSIS — I7789 Other specified disorders of arteries and arterioles: Secondary | ICD-10-CM

## 2013-03-05 DIAGNOSIS — I0989 Other specified rheumatic heart diseases: Secondary | ICD-10-CM | POA: Diagnosis not present

## 2013-03-05 DIAGNOSIS — I4891 Unspecified atrial fibrillation: Secondary | ICD-10-CM | POA: Diagnosis not present

## 2013-03-05 DIAGNOSIS — I079 Rheumatic tricuspid valve disease, unspecified: Secondary | ICD-10-CM | POA: Diagnosis not present

## 2013-03-05 NOTE — Progress Notes (Signed)
Echocardiogram performed.  

## 2013-04-09 DIAGNOSIS — H35369 Drusen (degenerative) of macula, unspecified eye: Secondary | ICD-10-CM | POA: Diagnosis not present

## 2013-04-09 DIAGNOSIS — H35379 Puckering of macula, unspecified eye: Secondary | ICD-10-CM | POA: Diagnosis not present

## 2013-06-12 ENCOUNTER — Other Ambulatory Visit: Payer: Self-pay | Admitting: Internal Medicine

## 2013-07-15 DIAGNOSIS — L570 Actinic keratosis: Secondary | ICD-10-CM | POA: Diagnosis not present

## 2013-07-15 DIAGNOSIS — L259 Unspecified contact dermatitis, unspecified cause: Secondary | ICD-10-CM | POA: Diagnosis not present

## 2013-07-15 DIAGNOSIS — L821 Other seborrheic keratosis: Secondary | ICD-10-CM | POA: Diagnosis not present

## 2013-07-15 DIAGNOSIS — D239 Other benign neoplasm of skin, unspecified: Secondary | ICD-10-CM | POA: Diagnosis not present

## 2013-07-15 DIAGNOSIS — L738 Other specified follicular disorders: Secondary | ICD-10-CM | POA: Diagnosis not present

## 2013-07-15 DIAGNOSIS — D1801 Hemangioma of skin and subcutaneous tissue: Secondary | ICD-10-CM | POA: Diagnosis not present

## 2013-07-15 DIAGNOSIS — D485 Neoplasm of uncertain behavior of skin: Secondary | ICD-10-CM | POA: Diagnosis not present

## 2013-07-31 DIAGNOSIS — B354 Tinea corporis: Secondary | ICD-10-CM | POA: Diagnosis not present

## 2013-07-31 DIAGNOSIS — L570 Actinic keratosis: Secondary | ICD-10-CM | POA: Diagnosis not present

## 2013-07-31 DIAGNOSIS — D485 Neoplasm of uncertain behavior of skin: Secondary | ICD-10-CM | POA: Diagnosis not present

## 2013-08-02 DIAGNOSIS — H1130 Conjunctival hemorrhage, unspecified eye: Secondary | ICD-10-CM | POA: Diagnosis not present

## 2013-08-20 DIAGNOSIS — H35379 Puckering of macula, unspecified eye: Secondary | ICD-10-CM | POA: Diagnosis not present

## 2013-08-20 DIAGNOSIS — H35369 Drusen (degenerative) of macula, unspecified eye: Secondary | ICD-10-CM | POA: Diagnosis not present

## 2013-08-20 DIAGNOSIS — Z961 Presence of intraocular lens: Secondary | ICD-10-CM | POA: Diagnosis not present

## 2013-09-06 ENCOUNTER — Other Ambulatory Visit: Payer: Self-pay | Admitting: Internal Medicine

## 2013-11-07 DIAGNOSIS — L259 Unspecified contact dermatitis, unspecified cause: Secondary | ICD-10-CM | POA: Diagnosis not present

## 2013-11-07 DIAGNOSIS — B351 Tinea unguium: Secondary | ICD-10-CM | POA: Diagnosis not present

## 2013-11-07 DIAGNOSIS — L570 Actinic keratosis: Secondary | ICD-10-CM | POA: Diagnosis not present

## 2013-11-07 DIAGNOSIS — B354 Tinea corporis: Secondary | ICD-10-CM | POA: Diagnosis not present

## 2013-11-07 DIAGNOSIS — B353 Tinea pedis: Secondary | ICD-10-CM | POA: Diagnosis not present

## 2014-02-11 DIAGNOSIS — E039 Hypothyroidism, unspecified: Secondary | ICD-10-CM | POA: Diagnosis not present

## 2014-02-11 DIAGNOSIS — Z79899 Other long term (current) drug therapy: Secondary | ICD-10-CM | POA: Diagnosis not present

## 2014-02-11 DIAGNOSIS — Z125 Encounter for screening for malignant neoplasm of prostate: Secondary | ICD-10-CM | POA: Diagnosis not present

## 2014-02-12 DIAGNOSIS — N400 Enlarged prostate without lower urinary tract symptoms: Secondary | ICD-10-CM | POA: Diagnosis not present

## 2014-02-12 DIAGNOSIS — R7301 Impaired fasting glucose: Secondary | ICD-10-CM | POA: Diagnosis not present

## 2014-02-12 DIAGNOSIS — Z0000 Encounter for general adult medical examination without abnormal findings: Secondary | ICD-10-CM | POA: Diagnosis not present

## 2014-02-12 DIAGNOSIS — I4891 Unspecified atrial fibrillation: Secondary | ICD-10-CM | POA: Diagnosis not present

## 2014-02-12 DIAGNOSIS — H33009 Unspecified retinal detachment with retinal break, unspecified eye: Secondary | ICD-10-CM | POA: Diagnosis not present

## 2014-02-12 DIAGNOSIS — R972 Elevated prostate specific antigen [PSA]: Secondary | ICD-10-CM | POA: Diagnosis not present

## 2014-02-12 DIAGNOSIS — E039 Hypothyroidism, unspecified: Secondary | ICD-10-CM | POA: Diagnosis not present

## 2014-02-12 DIAGNOSIS — Z125 Encounter for screening for malignant neoplasm of prostate: Secondary | ICD-10-CM | POA: Diagnosis not present

## 2014-02-12 DIAGNOSIS — M25519 Pain in unspecified shoulder: Secondary | ICD-10-CM | POA: Diagnosis not present

## 2014-02-13 DIAGNOSIS — Z1212 Encounter for screening for malignant neoplasm of rectum: Secondary | ICD-10-CM | POA: Diagnosis not present

## 2014-02-19 DIAGNOSIS — L821 Other seborrheic keratosis: Secondary | ICD-10-CM | POA: Diagnosis not present

## 2014-02-19 DIAGNOSIS — L570 Actinic keratosis: Secondary | ICD-10-CM | POA: Diagnosis not present

## 2014-02-19 DIAGNOSIS — B353 Tinea pedis: Secondary | ICD-10-CM | POA: Diagnosis not present

## 2014-02-19 DIAGNOSIS — B351 Tinea unguium: Secondary | ICD-10-CM | POA: Diagnosis not present

## 2014-03-11 DIAGNOSIS — H35369 Drusen (degenerative) of macula, unspecified eye: Secondary | ICD-10-CM | POA: Diagnosis not present

## 2014-03-11 DIAGNOSIS — H35379 Puckering of macula, unspecified eye: Secondary | ICD-10-CM | POA: Diagnosis not present

## 2014-03-17 ENCOUNTER — Encounter: Payer: Self-pay | Admitting: Internal Medicine

## 2014-03-17 ENCOUNTER — Ambulatory Visit (INDEPENDENT_AMBULATORY_CARE_PROVIDER_SITE_OTHER): Payer: Medicare Other | Admitting: Internal Medicine

## 2014-03-17 VITALS — BP 132/76 | HR 41 | Ht 70.0 in | Wt 180.8 lb

## 2014-03-17 DIAGNOSIS — I4891 Unspecified atrial fibrillation: Secondary | ICD-10-CM | POA: Diagnosis not present

## 2014-03-17 MED ORDER — RIVAROXABAN 20 MG PO TABS: 20.0000 mg | ORAL_TABLET | Freq: Every day | ORAL | Status: DC

## 2014-03-17 NOTE — Progress Notes (Signed)
PCP: Haywood Pao, MD  Jason Ali is a 73 y.o. male who presents today for routine electrophysiology followup.  Since last being seen in our clinic, the patient reports doing very well.  He remains very acive.  Today, he denies symptoms of palpitations, chest pain, shortness of breath,  lower extremity edema, presyncope, or syncope.  He has rare dizziness when he bends over.  The patient is otherwise without complaint today.   Past Medical History  Diagnosis Date  . Multinodular goiter   . Hyperlipidemia   . BPH (benign prostatic hyperplasia)   . Tinnitus   . Paroxysmal atrial fibrillation   . Hypothyroidism    Past Surgical History  Procedure Laterality Date  . Achilles tendon repair  1991  . Cataract extraction  2004  . Tonsillectomy and adenoidectomy  1943    at age 46 and repeat at age 4    Current Outpatient Prescriptions  Medication Sig Dispense Refill  . BYSTOLIC 5 MG tablet TAKE 1 TABLET BY MOUTH DAILY.  90 tablet  2  . Cholecalciferol (VITAMIN D3) 2000 UNITS TABS Take 1 capsule by mouth daily.       Marland Kitchen EPIPEN 2-PAK 0.3 MG/0.3ML SOAJ injection Inject as directed      . glucosamine-chondroitin 500-400 MG tablet Take 3 tablets by mouth daily.       Marland Kitchen levothyroxine (SYNTHROID, LEVOTHROID) 125 MCG tablet Take 125 mcg by mouth daily.      . Multiple Vitamin (MULTIVITAMIN) tablet Take 1 tablet by mouth daily.      Marland Kitchen omega-3 acid ethyl esters (LOVAZA) 1 G capsule Take 2 g by mouth 2 (two) times daily.      . vitamin C (ASCORBIC ACID) 500 MG tablet Take 500 mg by mouth daily.      . rivaroxaban (XARELTO) 20 MG TABS tablet Take 1 tablet (20 mg total) by mouth daily with supper.  30 tablet  11   No current facility-administered medications for this visit.    Physical Exam: Filed Vitals:   03/17/14 1229  BP: 132/76  Pulse: 41  Height: 5\' 10"  (1.778 m)  Weight: 180 lb 12.8 oz (82.01 kg)    GEN- The patient is well appearing, alert and oriented x 3 today.   Head-  normocephalic, atraumatic Eyes-  Sclera clear, conjunctiva pink Ears- hearing intact Oropharynx- clear Lungs- Clear to ausculation bilaterally, normal work of breathing Heart- Regular rate and rhythm, no murmurs, rubs or gallops, PMI not laterally displaced GI- soft, NT, ND, + BS Extremities- no clubbing, cyanosis, or edema  ekg today reveals sinus bradycardia 41 bpm  Assessment and Plan:  1. afib Doing very well with rate control CHADS2vasc score is now 2 (with upcoming birthday).  We had a long discussion regarding risks of stroke.  Today, I discussed Coumadin as well as novel anticoagulants including Pradaxa, Xarelto, Savaysa, and Eliquis today as indicated for risk reduction in stroke and systemic emboli with nonvalvular atrial fibrillation.  Risks, benefits, and alternatives to each of these drugs were discussed at length today.  He has chosen xarelto.  His crcl is >50.  I will therefore start xarelto 20mg  daily today.  Stop ASA.   Repeat echo at this time.  If his echo remains stable then I will likely repeat echo in 2 years.  2. HL Per Dr Osborne Casco.  Return in 1 year

## 2014-03-17 NOTE — Patient Instructions (Addendum)
Your physician wants you to follow-up in: 12 months with Dr Vallery Ridge will receive a reminder letter in the mail two months in advance. If you don't receive a letter, please call our office to schedule the follow-up appointment.  Your physician has recommended you make the following change in your medication:  1) Stop Aspirin 2) Xarelto 20mg  every night with supper  Your physician has requested that you have an echocardiogram. Echocardiography is a painless test that uses sound waves to create images of your heart. It provides your doctor with information about the size and shape of your heart and how well your heart's chambers and valves are working. This procedure takes approximately one hour. There are no restrictions for this procedure.

## 2014-03-20 ENCOUNTER — Ambulatory Visit (HOSPITAL_COMMUNITY): Payer: Medicare Other | Attending: Cardiovascular Disease

## 2014-03-20 DIAGNOSIS — I4891 Unspecified atrial fibrillation: Secondary | ICD-10-CM | POA: Insufficient documentation

## 2014-03-20 DIAGNOSIS — Z87891 Personal history of nicotine dependence: Secondary | ICD-10-CM | POA: Diagnosis not present

## 2014-03-20 DIAGNOSIS — I517 Cardiomegaly: Secondary | ICD-10-CM | POA: Diagnosis not present

## 2014-03-20 DIAGNOSIS — E785 Hyperlipidemia, unspecified: Secondary | ICD-10-CM | POA: Diagnosis not present

## 2014-03-20 DIAGNOSIS — E039 Hypothyroidism, unspecified: Secondary | ICD-10-CM | POA: Insufficient documentation

## 2014-03-20 NOTE — Progress Notes (Signed)
2D Echo completed. 03/20/2014

## 2014-03-26 ENCOUNTER — Telehealth: Payer: Self-pay | Admitting: Internal Medicine

## 2014-03-26 NOTE — Telephone Encounter (Signed)
New problem   Pt want to know results of echocardiogram.

## 2014-03-26 NOTE — Telephone Encounter (Signed)
Notified of echo results.  States he needs prior authorization for his Xarelto.  Has some samples now. Will forward to [employer]

## 2014-03-28 ENCOUNTER — Telehealth: Payer: Self-pay

## 2014-03-28 NOTE — Telephone Encounter (Signed)
samples

## 2014-04-28 ENCOUNTER — Emergency Department (HOSPITAL_COMMUNITY): Payer: Medicare Other

## 2014-04-28 ENCOUNTER — Emergency Department (HOSPITAL_COMMUNITY)
Admission: EM | Admit: 2014-04-28 | Discharge: 2014-04-28 | Disposition: A | Discharge status: Home / Self Care | Payer: Medicare Other | Attending: Emergency Medicine | Admitting: Emergency Medicine

## 2014-04-28 ENCOUNTER — Encounter (HOSPITAL_COMMUNITY): Payer: Self-pay | Admitting: Emergency Medicine

## 2014-04-28 DIAGNOSIS — Z79899 Other long term (current) drug therapy: Secondary | ICD-10-CM | POA: Diagnosis not present

## 2014-04-28 DIAGNOSIS — M25462 Effusion, left knee: Secondary | ICD-10-CM | POA: Diagnosis not present

## 2014-04-28 DIAGNOSIS — E039 Hypothyroidism, unspecified: Secondary | ICD-10-CM | POA: Insufficient documentation

## 2014-04-28 DIAGNOSIS — Y9389 Activity, other specified: Secondary | ICD-10-CM | POA: Diagnosis not present

## 2014-04-28 DIAGNOSIS — Z91040 Latex allergy status: Secondary | ICD-10-CM | POA: Insufficient documentation

## 2014-04-28 DIAGNOSIS — Y9289 Other specified places as the place of occurrence of the external cause: Secondary | ICD-10-CM | POA: Insufficient documentation

## 2014-04-28 DIAGNOSIS — Z87891 Personal history of nicotine dependence: Secondary | ICD-10-CM | POA: Insufficient documentation

## 2014-04-28 DIAGNOSIS — Z7901 Long term (current) use of anticoagulants: Secondary | ICD-10-CM | POA: Diagnosis not present

## 2014-04-28 DIAGNOSIS — X58XXXA Exposure to other specified factors, initial encounter: Secondary | ICD-10-CM | POA: Diagnosis not present

## 2014-04-28 DIAGNOSIS — S8992XA Unspecified injury of left lower leg, initial encounter: Secondary | ICD-10-CM | POA: Diagnosis not present

## 2014-04-28 DIAGNOSIS — R609 Edema, unspecified: Secondary | ICD-10-CM | POA: Diagnosis not present

## 2014-04-28 DIAGNOSIS — Z8669 Personal history of other diseases of the nervous system and sense organs: Secondary | ICD-10-CM | POA: Insufficient documentation

## 2014-04-28 DIAGNOSIS — M1712 Unilateral primary osteoarthritis, left knee: Secondary | ICD-10-CM | POA: Diagnosis not present

## 2014-04-28 DIAGNOSIS — I480 Paroxysmal atrial fibrillation: Secondary | ICD-10-CM | POA: Diagnosis not present

## 2014-04-28 DIAGNOSIS — Z87438 Personal history of other diseases of male genital organs: Secondary | ICD-10-CM | POA: Insufficient documentation

## 2014-04-28 MED ORDER — TRAMADOL HCL 50 MG PO TABS: 50.0000 mg | ORAL_TABLET | Freq: Four times a day (QID) | ORAL | Status: DC | PRN

## 2014-04-28 MED ORDER — TRAMADOL HCL 50 MG PO TABS
50.0000 mg | ORAL_TABLET | Freq: Once | ORAL | Status: AC
  Administered 2014-04-28: 50 mg via ORAL
  Filled 2014-04-28: qty 1

## 2014-04-28 NOTE — ED Notes (Signed)
Pt has extensive knee problems, he was doing yard work and injured his left knee again

## 2014-04-28 NOTE — Discharge Instructions (Signed)
Take Tramadol as needed for pain. Rest, ice and elevate your knee for symptom relief. Follow up with Dr. Rhona Raider for further evaluation. Refer to attached documents for more information.

## 2014-04-28 NOTE — ED Provider Notes (Signed)
CSN: [id number]     Arrival date & time 04/28/14  1905 History  This chart was scribed for non-physician practitioner working with [name], MD by Mercy Moore, ED Scribe. This patient was seen in room [place] and the patient's care was started at 8:13 PM.   Chief Complaint  Patient presents with  . Knee Pain   The history is provided by the patient. No language interpreter was used.   HPI Comments: Jason Ali is a 73 y.o. male who presents to the Emergency Department complaining of exacerbated left knee pain after recent injury sustained while performing yard work today. Patient reports descending stairs with a 30lb tub of grass/dirt and hearing an audible pop in his right knee. Patient shares that his knee was previously aggravated and mildly swollen from prolonged time spend on his knees while recaulking ten days ago. Patient reports exacerbation with bearing weight and extending the knee.    Past Medical History  Diagnosis Date  . Multinodular goiter   . Hyperlipidemia   . BPH (benign prostatic hyperplasia)   . Tinnitus   . Paroxysmal atrial fibrillation   . Hypothyroidism    Past Surgical History  Procedure Laterality Date  . Achilles tendon repair  1991  . Cataract extraction  2004  . Tonsillectomy and adenoidectomy  1943    at age 59 and repeat at age 30   Family History  Problem Relation Age of Onset  . Colon cancer Father 11   History  Substance Use Topics  . Smoking status: Former Smoker    Quit date: 01/15/1965  . Smokeless tobacco: Never Used  . Alcohol Use: 2.4 oz/week    4 Glasses of wine per week     Comment: occasional red wine 3 to 4 x per week    Review of Systems  Constitutional: Negative for chills and fatigue.  Musculoskeletal: Positive for arthralgias. Negative for gait problem.  Neurological: Negative for weakness and numbness.  All other systems reviewed and are negative.  Allergies  Bee venom; Eggs or egg-derived products; and  Latex  Home Medications   Prior to Admission medications   Medication Sig Start Date End Date Taking? Authorizing Provider  BIOTIN PO Take 1 tablet by mouth daily.   Yes Historical Provider, MD  Cholecalciferol (VITAMIN D3) 2000 UNITS TABS Take 2,000 Units by mouth daily.    Yes Historical Provider, MD  EPIPEN 2-PAK 0.3 MG/0.3ML SOAJ injection Inject 0.3 mg into the muscle daily as needed. Allergic reaction 01/23/14  Yes Historical Provider, MD  glucosamine-chondroitin 500-400 MG tablet Take 3 tablets by mouth daily.    Yes Historical Provider, MD  levothyroxine (SYNTHROID, LEVOTHROID) 125 MCG tablet Take 125 mcg by mouth daily.   Yes Historical Provider, MD  Multiple Vitamin (MULTIVITAMIN) tablet Take 1 tablet by mouth daily.   Yes Historical Provider, MD  nebivolol (BYSTOLIC) 5 MG tablet Take 5 mg by mouth daily.   Yes Historical Provider, MD  omega-3 acid ethyl esters (LOVAZA) 1 G capsule Take 2 g by mouth 2 (two) times daily.   Yes Historical Provider, MD  rivaroxaban (XARELTO) 20 MG TABS tablet Take 1 tablet (20 mg total) by mouth daily with supper. 03/17/14  Yes Thompson Grayer, MD  vitamin C (ASCORBIC ACID) 500 MG tablet Take 500 mg by mouth daily.   Yes Historical Provider, MD   Triage Vitals: BP 123/76  Pulse 53  Temp(Src) 99 F (37.2 C) (Oral)  Resp 16  SpO2 97%  Physical Exam  Nursing note and vitals reviewed. Constitutional: He is oriented to person, place, and time. He appears well-developed and well-nourished. No distress.  HENT:  Head: Normocephalic and atraumatic.  Eyes: EOM are normal.  Neck: Neck supple.  Cardiovascular: Normal rate.   Pulmonary/Chest: Effort normal. No respiratory distress.  Musculoskeletal: Normal range of motion. He exhibits tenderness. He exhibits no edema.  Left knee: No edema or obvious defformity. Medial left knee tenderness to palpation. No popliteal tenderness or calf swelling. Full ROM  Neurological: He is alert and oriented to person, place,  and time.  Skin: Skin is warm and dry.  Psychiatric: He has a normal mood and affect. His behavior is normal.    ED Course  Procedures (including critical care time)  COORDINATION OF CARE: 8:19 PM- Patient will be referred to orthopedic. Will order imaging of right knee. Discussed treatment plan with patient at bedside and patient agreed to plan.   Labs Review Labs Reviewed - No data to display  Imaging Review Dg Knee Complete 4 Views Left  04/28/2014   CLINICAL DATA:  While carrying a heavy low down stairs felt a popping and burning in the posterior left knee now with inability to bear weight.  EXAM: LEFT KNEE - COMPLETE 4+ VIEW  COMPARISON:  None.  FINDINGS: The bones of the knee are adequately mineralized. There is beaking of the tibial spines. The joint spaces are reasonably well maintained. There is a spur from the superior margin of the patella. The proximal fibula is intact. There is a small suprapatellar effusion.  IMPRESSION: There is mild degenerative change of the left knee. There is a small effusion. There is no acute fracture nor dislocation.   Electronically Signed   By: David  Martinique   On: 04/28/2014 37:04   SPLINT APPLICATION Date/Time: 88:89 PM Authorized by: Alvina Chou Consent: Verbal consent obtained. Risks and benefits: risks, benefits and alternatives were discussed Consent given by: patient Splint applied by: orthopedic technician Location details: left knee Splint type: knee sleeve Supplies used: knee sleeve Post-procedure: The splinted body part was neurovascularly unchanged following the procedure. Patient tolerance: Patient tolerated the procedure well with no immediate complications.    EKG Interpretation None      MDM   Final diagnoses:  Left knee injury, initial encounter    10:17 PM Patient's xray and CT negative for tibial plateau fracture or other acute injury. Patient will have a knee sleeve, tramadol, and recommended follow up with  Dr. Rhona Raider. No neurovascular compromise. Vitals stable and patient afebrile. No other injuries.   I personally performed the services described in this documentation, which was scribed in my presence. The recorded information has been reviewed and is accurate.    Alvina Chou, PA-C 04/28/14 [place], PA-C 04/28/14 2219

## 2014-04-30 DIAGNOSIS — M25562 Pain in left knee: Secondary | ICD-10-CM | POA: Diagnosis not present

## 2014-05-01 NOTE — ED Provider Notes (Signed)
Medical screening examination/treatment/procedure(s) were performed by non-physician practitioner and as supervising physician I was immediately available for consultation/collaboration.   EKG Interpretation None        [name], MD 05/01/14 [phone]

## 2014-05-07 DIAGNOSIS — M1712 Unilateral primary osteoarthritis, left knee: Secondary | ICD-10-CM | POA: Diagnosis not present

## 2014-05-09 ENCOUNTER — Encounter (HOSPITAL_BASED_OUTPATIENT_CLINIC_OR_DEPARTMENT_OTHER)
Admission: RE | Admit: 2014-05-09 | Discharge: 2014-05-09 | Disposition: A | Discharge status: Home / Self Care | Payer: Medicare Other | Source: Ambulatory Visit | Attending: Orthopaedic Surgery | Admitting: Orthopaedic Surgery

## 2014-05-09 ENCOUNTER — Encounter (HOSPITAL_BASED_OUTPATIENT_CLINIC_OR_DEPARTMENT_OTHER): Payer: Self-pay | Admitting: *Deleted

## 2014-05-09 DIAGNOSIS — M1712 Unilateral primary osteoarthritis, left knee: Secondary | ICD-10-CM | POA: Diagnosis not present

## 2014-05-09 DIAGNOSIS — M25562 Pain in left knee: Secondary | ICD-10-CM | POA: Diagnosis not present

## 2014-05-09 DIAGNOSIS — N400 Enlarged prostate without lower urinary tract symptoms: Secondary | ICD-10-CM | POA: Diagnosis not present

## 2014-05-09 DIAGNOSIS — Z91040 Latex allergy status: Secondary | ICD-10-CM | POA: Diagnosis not present

## 2014-05-09 DIAGNOSIS — E785 Hyperlipidemia, unspecified: Secondary | ICD-10-CM | POA: Diagnosis not present

## 2014-05-09 DIAGNOSIS — I10 Essential (primary) hypertension: Secondary | ICD-10-CM | POA: Diagnosis not present

## 2014-05-09 DIAGNOSIS — Z91012 Allergy to eggs: Secondary | ICD-10-CM | POA: Diagnosis not present

## 2014-05-09 DIAGNOSIS — S83242A Other tear of medial meniscus, current injury, left knee, initial encounter: Secondary | ICD-10-CM | POA: Diagnosis not present

## 2014-05-09 DIAGNOSIS — M2342 Loose body in knee, left knee: Secondary | ICD-10-CM | POA: Diagnosis not present

## 2014-05-09 DIAGNOSIS — H9319 Tinnitus, unspecified ear: Secondary | ICD-10-CM | POA: Diagnosis not present

## 2014-05-09 DIAGNOSIS — X58XXXA Exposure to other specified factors, initial encounter: Secondary | ICD-10-CM | POA: Diagnosis not present

## 2014-05-09 DIAGNOSIS — I480 Paroxysmal atrial fibrillation: Secondary | ICD-10-CM | POA: Diagnosis not present

## 2014-05-09 DIAGNOSIS — E039 Hypothyroidism, unspecified: Secondary | ICD-10-CM | POA: Diagnosis not present

## 2014-05-09 NOTE — Progress Notes (Signed)
Will come in for bmet-ekg 8/15 dr Nolon Bussing for hx af-

## 2014-05-09 NOTE — H&P (Signed)
Jason Ali is an 73 y.o. male.   Chief Complaint: Left knee pain HPI: , Jason Ali is complaining of ongoing left knee pain.  Pain with ambulation.  He localizes it along the medial joint line.  Recent MRI scan done on 05/07/14 shows a complex tear of the medial meniscus.We have discussed options with him and have elected to proceed with a left knee arthroscopy to take care of the torn meniscus.  He has had an injection previously with minimal benefit.  Past Medical History  Diagnosis Date  . Multinodular goiter   . Hyperlipidemia   . BPH (benign prostatic hyperplasia)   . Tinnitus   . Paroxysmal atrial fibrillation   . Hypothyroidism   . PONV (postoperative nausea and vomiting)     eggs  . Wears glasses   . Dysrhythmia   . Hypertension     Past Surgical History  Procedure Laterality Date  . Achilles tendon repair  1991    left  . Cataract extraction  2004    both  . Tonsillectomy and adenoidectomy  1943    at age 75 and repeat at age 56  . Colonoscopy      Family History  Problem Relation Age of Onset  . Colon cancer Father 4   Social History:  reports that he quit smoking about 49 years ago. He has never used smokeless tobacco. He reports that he drinks about 2.4 ounces of alcohol per week. He reports that he does not use illicit drugs.  Allergies:  Allergies  Allergen Reactions  . Bee Venom Anaphylaxis  . Eggs Or Egg-Derived Products Nausea And Vomiting  . Latex Rash    No prescriptions prior to admission    No results found for this or any previous visit (from the past 48 hour(s)). No results found.  Review of Systems  Constitutional: Negative.   HENT: Negative.   Eyes: Negative.   Respiratory: Negative.   Cardiovascular: Positive for palpitations.       History of atrial fibrillation  Gastrointestinal: Negative.   Genitourinary: Negative.   Musculoskeletal: Positive for joint pain.  Skin: Negative.   Neurological: Negative.   Endo/Heme/Allergies:  Negative.   Psychiatric/Behavioral: Negative.     Height 5\' 10"  (1.778 m), weight 81.647 kg (180 lb). Physical Exam  Constitutional: He appears well-developed.  HENT:  Head: Normocephalic.  Eyes: Pupils are equal, round, and reactive to light.  Neck: Normal range of motion.  Cardiovascular: Normal rate.   Respiratory: Effort normal.  GI: Soft.  Musculoskeletal:  Left knee exam significant medial joint line tenderness.  Motion is 10-90.  Pain with valgus stress  Neurological: He is alert.  Skin: Skin is warm.  Psychiatric: He has a normal mood and affect.     Assessment/Plan Assessment: Left knee torn medial meniscus by MRI scan 04/30/14 with previous injection. Plan: We have discussed with Jason Ali proceeding with a knee arthroscopy left side to take care of his medial meniscus tear.  We have discussed the risks of anesthesia, infection, DVT associated with this type arthroscopy procedure.  Also discussed the need for physical therapy to optimize his results postoperatively.  He will hold his blood thinnerXarelto for a few days preoperatively and then restarting it the night of surgery.  [name] R 05/09/2014, 1:43 PM

## 2014-05-12 ENCOUNTER — Other Ambulatory Visit: Payer: Self-pay | Admitting: Orthopaedic Surgery

## 2014-05-13 ENCOUNTER — Ambulatory Visit (HOSPITAL_BASED_OUTPATIENT_CLINIC_OR_DEPARTMENT_OTHER)
Admission: RE | Admit: 2014-05-13 | Discharge: 2014-05-13 | Disposition: A | Discharge status: Home / Self Care | Payer: Medicare Other | Source: Ambulatory Visit | Attending: Orthopaedic Surgery | Admitting: Orthopaedic Surgery

## 2014-05-13 ENCOUNTER — Ambulatory Visit (HOSPITAL_BASED_OUTPATIENT_CLINIC_OR_DEPARTMENT_OTHER): Payer: Medicare Other | Admitting: Anesthesiology

## 2014-05-13 ENCOUNTER — Encounter (HOSPITAL_BASED_OUTPATIENT_CLINIC_OR_DEPARTMENT_OTHER): Payer: Self-pay

## 2014-05-13 ENCOUNTER — Encounter (HOSPITAL_BASED_OUTPATIENT_CLINIC_OR_DEPARTMENT_OTHER)
Admission: RE | Disposition: A | Discharge status: Home / Self Care | Payer: Self-pay | Source: Ambulatory Visit | Attending: Orthopaedic Surgery

## 2014-05-13 ENCOUNTER — Encounter (HOSPITAL_BASED_OUTPATIENT_CLINIC_OR_DEPARTMENT_OTHER): Payer: Medicare Other | Admitting: Anesthesiology

## 2014-05-13 DIAGNOSIS — S83242A Other tear of medial meniscus, current injury, left knee, initial encounter: Secondary | ICD-10-CM | POA: Insufficient documentation

## 2014-05-13 DIAGNOSIS — E785 Hyperlipidemia, unspecified: Secondary | ICD-10-CM | POA: Insufficient documentation

## 2014-05-13 DIAGNOSIS — M1712 Unilateral primary osteoarthritis, left knee: Secondary | ICD-10-CM | POA: Diagnosis not present

## 2014-05-13 DIAGNOSIS — N400 Enlarged prostate without lower urinary tract symptoms: Secondary | ICD-10-CM | POA: Insufficient documentation

## 2014-05-13 DIAGNOSIS — M2242 Chondromalacia patellae, left knee: Secondary | ICD-10-CM | POA: Diagnosis not present

## 2014-05-13 DIAGNOSIS — I480 Paroxysmal atrial fibrillation: Secondary | ICD-10-CM | POA: Insufficient documentation

## 2014-05-13 DIAGNOSIS — M2342 Loose body in knee, left knee: Secondary | ICD-10-CM | POA: Diagnosis not present

## 2014-05-13 DIAGNOSIS — E039 Hypothyroidism, unspecified: Secondary | ICD-10-CM | POA: Insufficient documentation

## 2014-05-13 DIAGNOSIS — Z91040 Latex allergy status: Secondary | ICD-10-CM | POA: Insufficient documentation

## 2014-05-13 DIAGNOSIS — H9319 Tinnitus, unspecified ear: Secondary | ICD-10-CM | POA: Insufficient documentation

## 2014-05-13 DIAGNOSIS — Z91012 Allergy to eggs: Secondary | ICD-10-CM | POA: Insufficient documentation

## 2014-05-13 DIAGNOSIS — X58XXXA Exposure to other specified factors, initial encounter: Secondary | ICD-10-CM | POA: Insufficient documentation

## 2014-05-13 DIAGNOSIS — I10 Essential (primary) hypertension: Secondary | ICD-10-CM | POA: Insufficient documentation

## 2014-05-13 DIAGNOSIS — S83207A Unspecified tear of unspecified meniscus, current injury, left knee, initial encounter: Secondary | ICD-10-CM

## 2014-05-13 HISTORY — PX: KNEE ARTHROSCOPY WITH MEDIAL MENISECTOMY: SHX5651

## 2014-05-13 HISTORY — DX: Presence of spectacles and contact lenses: Z97.3

## 2014-05-13 HISTORY — PX: CHONDROPLASTY: SHX5177

## 2014-05-13 HISTORY — DX: Other specified postprocedural states: R11.2

## 2014-05-13 HISTORY — DX: Essential (primary) hypertension: I10

## 2014-05-13 HISTORY — DX: Other specified postprocedural states: Z98.890

## 2014-05-13 LAB — POCT HEMOGLOBIN-HEMACUE: Hemoglobin: 15.7 g/dL (ref 13.0–17.0)

## 2014-05-13 SURGERY — ARTHROSCOPY, KNEE
Anesthesia: Choice | Site: Knee | Laterality: Left

## 2014-05-13 SURGERY — ARTHROSCOPY, KNEE, WITH MEDIAL MENISCECTOMY
Anesthesia: General | Site: Knee | Laterality: Left

## 2014-05-13 MED ORDER — OXYCODONE HCL 5 MG PO TABS
ORAL_TABLET | ORAL | Status: AC
  Filled 2014-05-13: qty 1

## 2014-05-13 MED ORDER — METHYLPREDNISOLONE ACETATE 80 MG/ML IJ SUSP
INTRAMUSCULAR | Status: AC
  Filled 2014-05-13: qty 1

## 2014-05-13 MED ORDER — OXYCODONE HCL 5 MG/5ML PO SOLN: 5.0000 mg | Freq: Once | ORAL | Status: AC | PRN

## 2014-05-13 MED ORDER — MEPERIDINE HCL 25 MG/ML IJ SOLN: 6.2500 mg | INTRAMUSCULAR | Status: DC | PRN

## 2014-05-13 MED ORDER — HYDROMORPHONE HCL 1 MG/ML IJ SOLN: 0.2500 mg | INTRAMUSCULAR | Status: DC | PRN

## 2014-05-13 MED ORDER — HYDROCODONE-ACETAMINOPHEN 5-325 MG PO TABS: 1.0000 | ORAL_TABLET | Freq: Four times a day (QID) | ORAL | Status: DC | PRN

## 2014-05-13 MED ORDER — SODIUM CHLORIDE 0.9 % IR SOLN
Status: DC | PRN
Start: 2014-05-13 — End: 2014-05-13
  Administered 2014-05-13: 6000 mL

## 2014-05-13 MED ORDER — LACTATED RINGERS IV SOLN: INTRAVENOUS | Status: DC

## 2014-05-13 MED ORDER — CEFAZOLIN SODIUM-DEXTROSE 2-3 GM-% IV SOLR: 2.0000 g | INTRAVENOUS | Status: DC

## 2014-05-13 MED ORDER — METHYLPREDNISOLONE ACETATE 80 MG/ML IJ SUSP
INTRAMUSCULAR | Status: DC | PRN
  Administered 2014-05-13: 80 mg via INTRA_ARTICULAR

## 2014-05-13 MED ORDER — METHYLPREDNISOLONE ACETATE 40 MG/ML IJ SUSP
INTRAMUSCULAR | Status: AC
  Filled 2014-05-13: qty 1

## 2014-05-13 MED ORDER — FENTANYL CITRATE 0.05 MG/ML IJ SOLN
INTRAMUSCULAR | Status: AC
  Filled 2014-05-13: qty 6

## 2014-05-13 MED ORDER — OXYCODONE HCL 5 MG PO TABS
5.0000 mg | ORAL_TABLET | Freq: Once | ORAL | Status: AC | PRN
  Administered 2014-05-13: 5 mg via ORAL

## 2014-05-13 MED ORDER — MORPHINE SULFATE 4 MG/ML IJ SOLN
INTRAMUSCULAR | Status: DC | PRN
  Administered 2014-05-13: 4 mg via INTRAVENOUS

## 2014-05-13 MED ORDER — FENTANYL CITRATE 0.05 MG/ML IJ SOLN
INTRAMUSCULAR | Status: DC | PRN
  Administered 2014-05-13: 100 ug via INTRAVENOUS

## 2014-05-13 MED ORDER — FENTANYL CITRATE 0.05 MG/ML IJ SOLN
50.0000 ug | INTRAMUSCULAR | Status: DC | PRN
Start: 2014-05-13 — End: 2014-05-13

## 2014-05-13 MED ORDER — LIDOCAINE HCL (CARDIAC) 20 MG/ML IV SOLN
INTRAVENOUS | Status: DC | PRN
  Administered 2014-05-13: 50 mg via INTRAVENOUS

## 2014-05-13 MED ORDER — LACTATED RINGERS IV SOLN
INTRAVENOUS | Status: DC
  Administered 2014-05-13 (×2): via INTRAVENOUS

## 2014-05-13 MED ORDER — BUPIVACAINE-EPINEPHRINE 0.5% -1:200000 IJ SOLN
INTRAMUSCULAR | Status: DC | PRN
  Administered 2014-05-13: 20 mL

## 2014-05-13 MED ORDER — GLYCOPYRROLATE 0.2 MG/ML IJ SOLN
INTRAMUSCULAR | Status: DC | PRN
  Administered 2014-05-13: 0.2 mg via INTRAVENOUS

## 2014-05-13 MED ORDER — ONDANSETRON HCL 4 MG/2ML IJ SOLN: 4.0000 mg | Freq: Once | INTRAMUSCULAR | Status: DC | PRN

## 2014-05-13 MED ORDER — MORPHINE SULFATE 4 MG/ML IJ SOLN
INTRAMUSCULAR | Status: AC
  Filled 2014-05-13: qty 1

## 2014-05-13 MED ORDER — ONDANSETRON HCL 4 MG/2ML IJ SOLN
INTRAMUSCULAR | Status: DC | PRN
  Administered 2014-05-13: 4 mg via INTRAVENOUS

## 2014-05-13 MED ORDER — MIDAZOLAM HCL 5 MG/5ML IJ SOLN
INTRAMUSCULAR | Status: DC | PRN
  Administered 2014-05-13: 2 mg via INTRAVENOUS

## 2014-05-13 MED ORDER — DEXAMETHASONE SODIUM PHOSPHATE 4 MG/ML IJ SOLN
INTRAMUSCULAR | Status: DC | PRN
  Administered 2014-05-13: 10 mg via INTRAVENOUS

## 2014-05-13 MED ORDER — CEFAZOLIN SODIUM-DEXTROSE 2-3 GM-% IV SOLR
INTRAVENOUS | Status: AC
  Filled 2014-05-13: qty 50

## 2014-05-13 MED ORDER — MIDAZOLAM HCL 2 MG/2ML IJ SOLN
INTRAMUSCULAR | Status: AC
  Filled 2014-05-13: qty 2

## 2014-05-13 MED ORDER — PROPOFOL 10 MG/ML IV BOLUS
INTRAVENOUS | Status: DC | PRN
  Administered 2014-05-13: 15 mg via INTRAVENOUS

## 2014-05-13 MED ORDER — MIDAZOLAM HCL 2 MG/2ML IJ SOLN: 1.0000 mg | INTRAMUSCULAR | Status: DC | PRN

## 2014-05-13 SURGICAL SUPPLY — 40 items
BANDAGE ELASTIC 6 VELCRO ST LF (GAUZE/BANDAGES/DRESSINGS) ×2 IMPLANT
BLADE CUDA 5.5 (BLADE) IMPLANT
BLADE GREAT WHITE 4.2 (BLADE) ×2 IMPLANT
BNDG GAUZE ELAST 4 BULKY (GAUZE/BANDAGES/DRESSINGS) ×3 IMPLANT
CANISTER SUCT 3000ML (MISCELLANEOUS) IMPLANT
DRAPE ARTHROSCOPY W/POUCH 114 (DRAPES) ×2 IMPLANT
DRAPE U-SHAPE 47X51 STRL (DRAPES) ×2 IMPLANT
DRSG EMULSION OIL 3X3 NADH (GAUZE/BANDAGES/DRESSINGS) ×2 IMPLANT
DURAPREP 26ML APPLICATOR (WOUND CARE) ×2 IMPLANT
ELECT MENISCUS 165MM 90D (ELECTRODE) IMPLANT
ELECT REM PT RETURN 9FT ADLT (ELECTROSURGICAL)
ELECTRODE REM PT RTRN 9FT ADLT (ELECTROSURGICAL) IMPLANT
GAUZE SPONGE 4X4 12PLY STRL (GAUZE/BANDAGES/DRESSINGS) ×2 IMPLANT
GLOVE BIO SURGEON STRL SZ8 (GLOVE) ×2 IMPLANT
GLOVE BIOGEL PI IND STRL 7.0 (GLOVE) IMPLANT
GLOVE BIOGEL PI IND STRL 8 (GLOVE) ×2 IMPLANT
GLOVE BIOGEL PI INDICATOR 7.0 (GLOVE) ×2
GLOVE BIOGEL PI INDICATOR 8 (GLOVE) ×2
GLOVE SURG SS PI 6.5 STRL IVOR (GLOVE) ×1 IMPLANT
GLOVE SURG SS PI 7.0 STRL IVOR (GLOVE) ×1 IMPLANT
GLOVE SURG SS PI 8.0 STRL IVOR (GLOVE) ×2 IMPLANT
GOWN STRL REUS W/ TWL LRG LVL3 (GOWN DISPOSABLE) ×1 IMPLANT
GOWN STRL REUS W/ TWL XL LVL3 (GOWN DISPOSABLE) ×2 IMPLANT
GOWN STRL REUS W/TWL LRG LVL3 (GOWN DISPOSABLE) ×2
GOWN STRL REUS W/TWL XL LVL3 (GOWN DISPOSABLE) ×4
IV NS IRRIG 3000ML ARTHROMATIC (IV SOLUTION) ×3 IMPLANT
KNEE WRAP E Z 3 GEL PACK (MISCELLANEOUS) ×2 IMPLANT
MANIFOLD NEPTUNE II (INSTRUMENTS) IMPLANT
PACK ARTHROSCOPY DSU (CUSTOM PROCEDURE TRAY) ×2 IMPLANT
PACK BASIN DAY SURGERY FS (CUSTOM PROCEDURE TRAY) ×2 IMPLANT
PENCIL BUTTON HOLSTER BLD 10FT (ELECTRODE) IMPLANT
SET ARTHROSCOPY TUBING (MISCELLANEOUS) ×2
SET ARTHROSCOPY TUBING LN (MISCELLANEOUS) ×1 IMPLANT
SHEET MEDIUM DRAPE 40X70 STRL (DRAPES) ×2 IMPLANT
SYR 3ML 18GX1 1/2 (SYRINGE) IMPLANT
TOWEL OR 17X24 6PK STRL BLUE (TOWEL DISPOSABLE) ×2 IMPLANT
TOWEL OR NON WOVEN STRL DISP B (DISPOSABLE) ×2 IMPLANT
WAND 30 DEG SABER W/CORD (SURGICAL WAND) IMPLANT
WAND STAR VAC 90 (SURGICAL WAND) IMPLANT
WATER STERILE IRR 1000ML POUR (IV SOLUTION) ×2 IMPLANT

## 2014-05-13 NOTE — Anesthesia Postprocedure Evaluation (Signed)
Anesthesia Post Note  Patient: Jason Ali  Procedure(s) Performed: Procedure(s) (LRB): KNEE ARTHROSCOPY WITH MEDIAL MENISECTOMY (Left) CHONDROPLASTY (Left)  Anesthesia type: general  Patient location: PACU  Post pain: Pain level controlled  Post assessment: Patient's Cardiovascular Status Stable  Last Vitals:  Filed Vitals:   05/13/14 1515  BP: 132/80  Pulse: 81  Temp: 36.8 C  Resp: 16    Post vital signs: Reviewed and stable  Level of consciousness: sedated  Complications: No apparent anesthesia complications

## 2014-05-13 NOTE — Anesthesia Preprocedure Evaluation (Signed)
Anesthesia Evaluation  Patient identified by MRN, date of birth, ID band Patient awake    Reviewed: Allergy & Precautions, H&P , NPO status , Patient's Chart, lab work & pertinent test results  History of Anesthesia Complications (+) PONV  Airway Mallampati: I TM Distance: >3 FB Neck ROM: Full    Dental   Pulmonary former smoker,          Cardiovascular hypertension, Pt. on medications     Neuro/Psych    GI/Hepatic   Endo/Other  Hypothyroidism   Renal/GU      Musculoskeletal   Abdominal   Peds  Hematology   Anesthesia Other Findings   Reproductive/Obstetrics                           Anesthesia Physical Anesthesia Plan  ASA: III  Anesthesia Plan: General   Post-op Pain Management:    Induction: Intravenous  Airway Management Planned: LMA  Additional Equipment:   Intra-op Plan:   Post-operative Plan: Extubation in OR  Informed Consent: I have reviewed the patients History and Physical, chart, labs and discussed the procedure including the risks, benefits and alternatives for the proposed anesthesia with the patient or authorized representative who has indicated his/her understanding and acceptance.     Plan Discussed with: CRNA and Surgeon  Anesthesia Plan Comments:         Anesthesia Quick Evaluation

## 2014-05-13 NOTE — Interval H&P Note (Signed)
History and Physical Interval Note:  05/13/2014 12:24 PM  Jason Ali  has presented today for surgery, with the diagnosis of LEFT MEDIAL MENISCUS TEAR CHONDROMALASIA   The various methods of treatment have been discussed with the patient and family. After consideration of risks, benefits and other options for treatment, the patient has consented to  Procedure(s): LEFT KNEE ARTHROSCOPY (Left) as a surgical intervention .  The patient's history has been reviewed, patient examined, no change in status, stable for surgery.  I have reviewed the patient's chart and labs.  Questions were answered to the patient's satisfaction.     [name]

## 2014-05-13 NOTE — Discharge Instructions (Signed)

## 2014-05-13 NOTE — Anesthesia Procedure Notes (Signed)
Procedure Name: LMA Insertion Date/Time: 05/13/2014 1:10 PM Performed by: Melynda Ripple D Pre-anesthesia Checklist: Patient identified, Emergency Drugs available, Suction available and Patient being monitored Patient Re-evaluated:Patient Re-evaluated prior to inductionOxygen Delivery Method: Circle System Utilized Preoxygenation: Pre-oxygenation with 100% oxygen Intubation Type: IV induction Ventilation: Mask ventilation without difficulty LMA: LMA inserted LMA Size: 4.0 Number of attempts: 1 Airway Equipment and Method: bite block Placement Confirmation: positive ETCO2 Tube secured with: Tape Dental Injury: Teeth and Oropharynx as per pre-operative assessment

## 2014-05-13 NOTE — Transfer of Care (Signed)
Immediate Anesthesia Transfer of Care Note  Patient: Jason Ali  Procedure(s) Performed: Procedure(s): KNEE ARTHROSCOPY WITH MEDIAL MENISECTOMY (Left) CHONDROPLASTY (Left)  Patient Location: PACU  Anesthesia Type:General  Level of Consciousness: awake, alert , oriented and patient cooperative  Airway & Oxygen Therapy: Patient Spontanous Breathing and Patient connected to nasal cannula oxygen  Post-op Assessment: Report given to PACU RN and Post -op Vital signs reviewed and stable  Post vital signs: Reviewed and stable  Complications: No apparent anesthesia complications

## 2014-05-13 NOTE — Op Note (Signed)
#  [id number] 

## 2014-05-14 ENCOUNTER — Encounter (HOSPITAL_BASED_OUTPATIENT_CLINIC_OR_DEPARTMENT_OTHER): Payer: Self-pay | Admitting: Orthopaedic Surgery

## 2014-05-14 NOTE — Op Note (Signed)
NAME:  Jason Ali, Jason Ali             ACCOUNT NO.:  [id number]  MEDICAL RECORD NO.:  [id number]  LOCATION:                                 FACILITY:  PHYSICIAN:  Monico Blitz. Glorimar Stroope, M.D.DATE OF BIRTH:  21-Jul-1941  DATE OF PROCEDURE:  05/13/2014 DATE OF DISCHARGE:  05/13/2014                              OPERATIVE REPORT   PREOPERATIVE DIAGNOSES: 1. Left knee torn medial meniscus. 2. Left knee degenerative joint disease.  POSTOPERATIVE DIAGNOSES: 1. Left knee torn medial meniscus. 2. Left knee degenerative joint disease.  PROCEDURES: 1. Left knee partial medial meniscectomy. 2. Left knee abrasion chondroplasty patellofemoral.  ANESTHESIA:  General.  ATTENDING SURGEON:  Monico Blitz. 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  He has pain which limits his ability to stand and rest and walk.  He has failed an injection and bracing.  He has been through an MRI scan, which shows a meniscal tear and some degenerative changes.  He is offered an arthroscopy.  Informed operative consent was obtained after discussion of possible complications including reaction to anesthesia and infection.  SUMMARY OF FINDINGS AND PROCEDURE:  Under general anesthesia, an arthroscopy of the left knee was performed through total of 2 portals. Findings were in the suprapatellar pouch that he had some cartilaginous loose bodies which I removed.  The patellofemoral joint exhibited broad areas of grade 4 change in the inter-trochlear groove and a thorough chondroplasty was done along with abrasion to bleeding bone and some small areas.  The medial compartment was notable for a displaceable tear of the posterior horn of the medial meniscus consistent with his scan. I performed about 25% partial medial meniscectomy back to stable tissues.  He had some  grade 3 change medial with no exposed bone.  ACL was normal in the lateral compartment exhibited no evidence of meniscal or articular cartilage injury.  He was injected with the usual agents plus some Depo-Medrol and discharged home same day.  DESCRIPTION OF PROCEDURE:  The patient was taken to the operating suite where general anesthetic was applied without difficulty.  He was positioned supine and prepped and draped in normal sterile fashion. After the administration of preop IV Kefzol and an appropriate time out, an arthroscopy of the left knee was performed through a total of 2 portals.  Findings were as noted above and procedure consisted predominantly the partial medial meniscectomy was done with 2 different shavers and a basket.  I also performed the abrasion chondroplasty as described above in a separate compartment.  The knee was thoroughly irrigated followed by placement of Marcaine with epinephrine and morphine plus some Depo-Medrol.  Adaptic was placed over the portals followed by dry gauze and a loose Ace wrap.  ESTIMATED BLOOD LOSS AND INTRAOPERATIVE FLUIDS:  Obtained from anesthesia records.  DISPOSITION:  The patient was extubated in the operating room and taken to recovery in stable addition.  He was to go home same-day and follow up in the office closely.  I will contact him by phone tonight.  Monico Blitz Rhona Raider, M.D.     PGD/MEDQ  D:  05/13/2014  T:  05/13/2014  Job:  [id number]

## 2014-05-20 ENCOUNTER — Telehealth: Payer: Self-pay

## 2014-05-20 NOTE — Telephone Encounter (Signed)
Pt aware PA was obtained and he can get it filled.

## 2014-05-20 NOTE — Telephone Encounter (Signed)
Comment: Patient called about PA on his xarelto he has only 4 pills left. Number to call [phone], ID # [id number].  Please call patient when this has been done. I will give him samples until this is done

## 2014-05-20 NOTE — Telephone Encounter (Signed)
Called and obtained PA for Xarelto thru [facility].

## 2014-05-23 DIAGNOSIS — M1712 Unilateral primary osteoarthritis, left knee: Secondary | ICD-10-CM | POA: Diagnosis not present

## 2014-05-23 DIAGNOSIS — M25562 Pain in left knee: Secondary | ICD-10-CM | POA: Diagnosis not present

## 2014-05-23 DIAGNOSIS — M25662 Stiffness of left knee, not elsewhere classified: Secondary | ICD-10-CM | POA: Diagnosis not present

## 2014-05-23 DIAGNOSIS — S83242D Other tear of medial meniscus, current injury, left knee, subsequent encounter: Secondary | ICD-10-CM | POA: Diagnosis not present

## 2014-05-26 DIAGNOSIS — Z79899 Other long term (current) drug therapy: Secondary | ICD-10-CM | POA: Diagnosis not present

## 2014-05-26 DIAGNOSIS — B354 Tinea corporis: Secondary | ICD-10-CM | POA: Diagnosis not present

## 2014-05-26 DIAGNOSIS — B351 Tinea unguium: Secondary | ICD-10-CM | POA: Diagnosis not present

## 2014-05-28 DIAGNOSIS — S83242D Other tear of medial meniscus, current injury, left knee, subsequent encounter: Secondary | ICD-10-CM | POA: Diagnosis not present

## 2014-05-28 DIAGNOSIS — M1712 Unilateral primary osteoarthritis, left knee: Secondary | ICD-10-CM | POA: Diagnosis not present

## 2014-05-28 DIAGNOSIS — M25562 Pain in left knee: Secondary | ICD-10-CM | POA: Diagnosis not present

## 2014-05-28 DIAGNOSIS — M25662 Stiffness of left knee, not elsewhere classified: Secondary | ICD-10-CM | POA: Diagnosis not present

## 2014-05-30 DIAGNOSIS — S83242D Other tear of medial meniscus, current injury, left knee, subsequent encounter: Secondary | ICD-10-CM | POA: Diagnosis not present

## 2014-05-30 DIAGNOSIS — M1712 Unilateral primary osteoarthritis, left knee: Secondary | ICD-10-CM | POA: Diagnosis not present

## 2014-05-30 DIAGNOSIS — M25662 Stiffness of left knee, not elsewhere classified: Secondary | ICD-10-CM | POA: Diagnosis not present

## 2014-05-30 DIAGNOSIS — M25562 Pain in left knee: Secondary | ICD-10-CM | POA: Diagnosis not present

## 2014-06-04 DIAGNOSIS — M25662 Stiffness of left knee, not elsewhere classified: Secondary | ICD-10-CM | POA: Diagnosis not present

## 2014-06-04 DIAGNOSIS — M1712 Unilateral primary osteoarthritis, left knee: Secondary | ICD-10-CM | POA: Diagnosis not present

## 2014-06-04 DIAGNOSIS — M25562 Pain in left knee: Secondary | ICD-10-CM | POA: Diagnosis not present

## 2014-06-04 DIAGNOSIS — S83242D Other tear of medial meniscus, current injury, left knee, subsequent encounter: Secondary | ICD-10-CM | POA: Diagnosis not present

## 2014-06-06 DIAGNOSIS — M25562 Pain in left knee: Secondary | ICD-10-CM | POA: Diagnosis not present

## 2014-06-06 DIAGNOSIS — S83242D Other tear of medial meniscus, current injury, left knee, subsequent encounter: Secondary | ICD-10-CM | POA: Diagnosis not present

## 2014-06-06 DIAGNOSIS — M1712 Unilateral primary osteoarthritis, left knee: Secondary | ICD-10-CM | POA: Diagnosis not present

## 2014-06-06 DIAGNOSIS — M25662 Stiffness of left knee, not elsewhere classified: Secondary | ICD-10-CM | POA: Diagnosis not present

## 2014-06-11 DIAGNOSIS — M25562 Pain in left knee: Secondary | ICD-10-CM | POA: Diagnosis not present

## 2014-06-11 DIAGNOSIS — S39012A Strain of muscle, fascia and tendon of lower back, initial encounter: Secondary | ICD-10-CM | POA: Diagnosis not present

## 2014-06-11 DIAGNOSIS — M25662 Stiffness of left knee, not elsewhere classified: Secondary | ICD-10-CM | POA: Diagnosis not present

## 2014-06-11 DIAGNOSIS — Z9889 Other specified postprocedural states: Secondary | ICD-10-CM | POA: Diagnosis not present

## 2014-06-11 DIAGNOSIS — S83242D Other tear of medial meniscus, current injury, left knee, subsequent encounter: Secondary | ICD-10-CM | POA: Diagnosis not present

## 2014-06-11 DIAGNOSIS — M1712 Unilateral primary osteoarthritis, left knee: Secondary | ICD-10-CM | POA: Diagnosis not present

## 2014-06-12 ENCOUNTER — Other Ambulatory Visit: Payer: Self-pay | Admitting: Internal Medicine

## 2014-06-13 DIAGNOSIS — M545 Low back pain: Secondary | ICD-10-CM | POA: Diagnosis not present

## 2014-06-13 DIAGNOSIS — M25662 Stiffness of left knee, not elsewhere classified: Secondary | ICD-10-CM | POA: Diagnosis not present

## 2014-06-13 DIAGNOSIS — S83242D Other tear of medial meniscus, current injury, left knee, subsequent encounter: Secondary | ICD-10-CM | POA: Diagnosis not present

## 2014-06-13 DIAGNOSIS — M25562 Pain in left knee: Secondary | ICD-10-CM | POA: Diagnosis not present

## 2014-06-13 DIAGNOSIS — M1712 Unilateral primary osteoarthritis, left knee: Secondary | ICD-10-CM | POA: Diagnosis not present

## 2014-06-16 DIAGNOSIS — S83242D Other tear of medial meniscus, current injury, left knee, subsequent encounter: Secondary | ICD-10-CM | POA: Diagnosis not present

## 2014-06-16 DIAGNOSIS — M25562 Pain in left knee: Secondary | ICD-10-CM | POA: Diagnosis not present

## 2014-06-16 DIAGNOSIS — M25662 Stiffness of left knee, not elsewhere classified: Secondary | ICD-10-CM | POA: Diagnosis not present

## 2014-06-16 DIAGNOSIS — M1712 Unilateral primary osteoarthritis, left knee: Secondary | ICD-10-CM | POA: Diagnosis not present

## 2014-06-16 DIAGNOSIS — M545 Low back pain: Secondary | ICD-10-CM | POA: Diagnosis not present

## 2014-06-18 DIAGNOSIS — M25662 Stiffness of left knee, not elsewhere classified: Secondary | ICD-10-CM | POA: Diagnosis not present

## 2014-06-18 DIAGNOSIS — M25562 Pain in left knee: Secondary | ICD-10-CM | POA: Diagnosis not present

## 2014-06-18 DIAGNOSIS — M1712 Unilateral primary osteoarthritis, left knee: Secondary | ICD-10-CM | POA: Diagnosis not present

## 2014-06-18 DIAGNOSIS — S83242D Other tear of medial meniscus, current injury, left knee, subsequent encounter: Secondary | ICD-10-CM | POA: Diagnosis not present

## 2014-06-24 DIAGNOSIS — M25562 Pain in left knee: Secondary | ICD-10-CM | POA: Diagnosis not present

## 2014-06-24 DIAGNOSIS — M1712 Unilateral primary osteoarthritis, left knee: Secondary | ICD-10-CM | POA: Diagnosis not present

## 2014-06-24 DIAGNOSIS — M25662 Stiffness of left knee, not elsewhere classified: Secondary | ICD-10-CM | POA: Diagnosis not present

## 2014-06-24 DIAGNOSIS — S83242D Other tear of medial meniscus, current injury, left knee, subsequent encounter: Secondary | ICD-10-CM | POA: Diagnosis not present

## 2014-06-27 DIAGNOSIS — M1712 Unilateral primary osteoarthritis, left knee: Secondary | ICD-10-CM | POA: Diagnosis not present

## 2014-06-27 DIAGNOSIS — M25562 Pain in left knee: Secondary | ICD-10-CM | POA: Diagnosis not present

## 2014-06-27 DIAGNOSIS — M25662 Stiffness of left knee, not elsewhere classified: Secondary | ICD-10-CM | POA: Diagnosis not present

## 2014-06-27 DIAGNOSIS — S83242D Other tear of medial meniscus, current injury, left knee, subsequent encounter: Secondary | ICD-10-CM | POA: Diagnosis not present

## 2014-07-01 DIAGNOSIS — M25662 Stiffness of left knee, not elsewhere classified: Secondary | ICD-10-CM | POA: Diagnosis not present

## 2014-07-01 DIAGNOSIS — M25562 Pain in left knee: Secondary | ICD-10-CM | POA: Diagnosis not present

## 2014-07-01 DIAGNOSIS — S83242D Other tear of medial meniscus, current injury, left knee, subsequent encounter: Secondary | ICD-10-CM | POA: Diagnosis not present

## 2014-07-01 DIAGNOSIS — M1712 Unilateral primary osteoarthritis, left knee: Secondary | ICD-10-CM | POA: Diagnosis not present

## 2014-07-03 DIAGNOSIS — S83242D Other tear of medial meniscus, current injury, left knee, subsequent encounter: Secondary | ICD-10-CM | POA: Diagnosis not present

## 2014-07-03 DIAGNOSIS — M25562 Pain in left knee: Secondary | ICD-10-CM | POA: Diagnosis not present

## 2014-07-03 DIAGNOSIS — M25662 Stiffness of left knee, not elsewhere classified: Secondary | ICD-10-CM | POA: Diagnosis not present

## 2014-07-03 DIAGNOSIS — M1712 Unilateral primary osteoarthritis, left knee: Secondary | ICD-10-CM | POA: Diagnosis not present

## 2014-07-07 DIAGNOSIS — Z79899 Other long term (current) drug therapy: Secondary | ICD-10-CM | POA: Diagnosis not present

## 2014-07-07 DIAGNOSIS — B351 Tinea unguium: Secondary | ICD-10-CM | POA: Diagnosis not present

## 2014-07-08 DIAGNOSIS — M25662 Stiffness of left knee, not elsewhere classified: Secondary | ICD-10-CM | POA: Diagnosis not present

## 2014-07-08 DIAGNOSIS — M25562 Pain in left knee: Secondary | ICD-10-CM | POA: Diagnosis not present

## 2014-07-08 DIAGNOSIS — M1712 Unilateral primary osteoarthritis, left knee: Secondary | ICD-10-CM | POA: Diagnosis not present

## 2014-07-08 DIAGNOSIS — S83242D Other tear of medial meniscus, current injury, left knee, subsequent encounter: Secondary | ICD-10-CM | POA: Diagnosis not present

## 2014-07-10 DIAGNOSIS — S83242D Other tear of medial meniscus, current injury, left knee, subsequent encounter: Secondary | ICD-10-CM | POA: Diagnosis not present

## 2014-07-10 DIAGNOSIS — M1712 Unilateral primary osteoarthritis, left knee: Secondary | ICD-10-CM | POA: Diagnosis not present

## 2014-07-10 DIAGNOSIS — M25562 Pain in left knee: Secondary | ICD-10-CM | POA: Diagnosis not present

## 2014-07-10 DIAGNOSIS — M25662 Stiffness of left knee, not elsewhere classified: Secondary | ICD-10-CM | POA: Diagnosis not present

## 2014-07-15 DIAGNOSIS — M25662 Stiffness of left knee, not elsewhere classified: Secondary | ICD-10-CM | POA: Diagnosis not present

## 2014-07-15 DIAGNOSIS — S83242D Other tear of medial meniscus, current injury, left knee, subsequent encounter: Secondary | ICD-10-CM | POA: Diagnosis not present

## 2014-07-15 DIAGNOSIS — M25562 Pain in left knee: Secondary | ICD-10-CM | POA: Diagnosis not present

## 2014-07-15 DIAGNOSIS — M1712 Unilateral primary osteoarthritis, left knee: Secondary | ICD-10-CM | POA: Diagnosis not present

## 2014-07-17 DIAGNOSIS — M25662 Stiffness of left knee, not elsewhere classified: Secondary | ICD-10-CM | POA: Diagnosis not present

## 2014-07-17 DIAGNOSIS — S83242D Other tear of medial meniscus, current injury, left knee, subsequent encounter: Secondary | ICD-10-CM | POA: Diagnosis not present

## 2014-07-17 DIAGNOSIS — M1712 Unilateral primary osteoarthritis, left knee: Secondary | ICD-10-CM | POA: Diagnosis not present

## 2014-07-17 DIAGNOSIS — M25562 Pain in left knee: Secondary | ICD-10-CM | POA: Diagnosis not present

## 2014-07-22 DIAGNOSIS — M25662 Stiffness of left knee, not elsewhere classified: Secondary | ICD-10-CM | POA: Diagnosis not present

## 2014-07-22 DIAGNOSIS — M1712 Unilateral primary osteoarthritis, left knee: Secondary | ICD-10-CM | POA: Diagnosis not present

## 2014-07-22 DIAGNOSIS — M25562 Pain in left knee: Secondary | ICD-10-CM | POA: Diagnosis not present

## 2014-07-22 DIAGNOSIS — S83242D Other tear of medial meniscus, current injury, left knee, subsequent encounter: Secondary | ICD-10-CM | POA: Diagnosis not present

## 2014-07-24 DIAGNOSIS — M1712 Unilateral primary osteoarthritis, left knee: Secondary | ICD-10-CM | POA: Diagnosis not present

## 2014-07-24 DIAGNOSIS — S83242D Other tear of medial meniscus, current injury, left knee, subsequent encounter: Secondary | ICD-10-CM | POA: Diagnosis not present

## 2014-07-24 DIAGNOSIS — M25662 Stiffness of left knee, not elsewhere classified: Secondary | ICD-10-CM | POA: Diagnosis not present

## 2014-07-24 DIAGNOSIS — M25562 Pain in left knee: Secondary | ICD-10-CM | POA: Diagnosis not present

## 2014-07-28 DIAGNOSIS — Z9889 Other specified postprocedural states: Secondary | ICD-10-CM | POA: Diagnosis not present

## 2014-08-29 DIAGNOSIS — M25562 Pain in left knee: Secondary | ICD-10-CM | POA: Diagnosis not present

## 2014-09-22 DIAGNOSIS — B351 Tinea unguium: Secondary | ICD-10-CM | POA: Diagnosis not present

## 2014-09-22 DIAGNOSIS — D1801 Hemangioma of skin and subcutaneous tissue: Secondary | ICD-10-CM | POA: Diagnosis not present

## 2014-09-22 DIAGNOSIS — L821 Other seborrheic keratosis: Secondary | ICD-10-CM | POA: Diagnosis not present

## 2014-09-22 DIAGNOSIS — L738 Other specified follicular disorders: Secondary | ICD-10-CM | POA: Diagnosis not present

## 2014-09-22 DIAGNOSIS — D225 Melanocytic nevi of trunk: Secondary | ICD-10-CM | POA: Diagnosis not present

## 2014-09-22 DIAGNOSIS — L570 Actinic keratosis: Secondary | ICD-10-CM | POA: Diagnosis not present

## 2014-09-23 DIAGNOSIS — H35371 Puckering of macula, right eye: Secondary | ICD-10-CM | POA: Diagnosis not present

## 2014-09-23 DIAGNOSIS — H43813 Vitreous degeneration, bilateral: Secondary | ICD-10-CM | POA: Diagnosis not present

## 2014-09-23 DIAGNOSIS — H35363 Drusen (degenerative) of macula, bilateral: Secondary | ICD-10-CM | POA: Diagnosis not present

## 2014-09-23 DIAGNOSIS — Z961 Presence of intraocular lens: Secondary | ICD-10-CM | POA: Diagnosis not present

## 2014-09-26 DIAGNOSIS — M25562 Pain in left knee: Secondary | ICD-10-CM | POA: Diagnosis not present

## 2014-12-06 ENCOUNTER — Emergency Department (HOSPITAL_COMMUNITY)
Admission: EM | Admit: 2014-12-06 | Discharge: 2014-12-06 | Disposition: A | Discharge status: Home / Self Care | Payer: Medicare Other | Attending: Emergency Medicine | Admitting: Emergency Medicine

## 2014-12-06 ENCOUNTER — Encounter (HOSPITAL_COMMUNITY): Payer: Self-pay | Admitting: *Deleted

## 2014-12-06 DIAGNOSIS — Z79899 Other long term (current) drug therapy: Secondary | ICD-10-CM | POA: Diagnosis not present

## 2014-12-06 DIAGNOSIS — I10 Essential (primary) hypertension: Secondary | ICD-10-CM | POA: Insufficient documentation

## 2014-12-06 DIAGNOSIS — Z87448 Personal history of other diseases of urinary system: Secondary | ICD-10-CM | POA: Insufficient documentation

## 2014-12-06 DIAGNOSIS — Y9389 Activity, other specified: Secondary | ICD-10-CM | POA: Diagnosis not present

## 2014-12-06 DIAGNOSIS — Y998 Other external cause status: Secondary | ICD-10-CM | POA: Diagnosis not present

## 2014-12-06 DIAGNOSIS — T7840XA Allergy, unspecified, initial encounter: Secondary | ICD-10-CM | POA: Insufficient documentation

## 2014-12-06 DIAGNOSIS — X58XXXA Exposure to other specified factors, initial encounter: Secondary | ICD-10-CM | POA: Insufficient documentation

## 2014-12-06 DIAGNOSIS — I480 Paroxysmal atrial fibrillation: Secondary | ICD-10-CM | POA: Insufficient documentation

## 2014-12-06 DIAGNOSIS — E039 Hypothyroidism, unspecified: Secondary | ICD-10-CM | POA: Insufficient documentation

## 2014-12-06 DIAGNOSIS — E785 Hyperlipidemia, unspecified: Secondary | ICD-10-CM | POA: Insufficient documentation

## 2014-12-06 DIAGNOSIS — Z91040 Latex allergy status: Secondary | ICD-10-CM | POA: Diagnosis not present

## 2014-12-06 DIAGNOSIS — Y9289 Other specified places as the place of occurrence of the external cause: Secondary | ICD-10-CM | POA: Diagnosis not present

## 2014-12-06 DIAGNOSIS — R21 Rash and other nonspecific skin eruption: Secondary | ICD-10-CM | POA: Insufficient documentation

## 2014-12-06 DIAGNOSIS — Z87891 Personal history of nicotine dependence: Secondary | ICD-10-CM | POA: Insufficient documentation

## 2014-12-06 MED ORDER — PREDNISONE 20 MG PO TABS
60.0000 mg | ORAL_TABLET | Freq: Every day | ORAL | Status: DC
Start: 2014-12-06 — End: 2015-04-13

## 2014-12-06 MED ORDER — RANITIDINE HCL 150 MG/10ML PO SYRP
300.0000 mg | ORAL_SOLUTION | Freq: Once | ORAL | Status: AC
  Administered 2014-12-06: 300 mg via ORAL
  Filled 2014-12-06: qty 20

## 2014-12-06 MED ORDER — DIPHENHYDRAMINE HCL 25 MG PO CAPS
50.0000 mg | ORAL_CAPSULE | Freq: Once | ORAL | Status: AC
  Administered 2014-12-06: 50 mg via ORAL
  Filled 2014-12-06: qty 2

## 2014-12-06 MED ORDER — EPINEPHRINE 0.3 MG/0.3ML IJ SOAJ
0.3000 mg | Freq: Once | INTRAMUSCULAR | Status: AC
  Administered 2014-12-06: 0.3 mg via INTRAMUSCULAR
  Filled 2014-12-06: qty 0.3

## 2014-12-06 MED ORDER — SODIUM CHLORIDE 0.9 % IV BOLUS (SEPSIS)
1000.0000 mL | Freq: Once | INTRAVENOUS | Status: AC
  Administered 2014-12-06: 1000 mL via INTRAVENOUS

## 2014-12-06 MED ORDER — PREDNISONE 20 MG PO TABS
60.0000 mg | ORAL_TABLET | Freq: Once | ORAL | Status: AC
  Administered 2014-12-06: 60 mg via ORAL
  Filled 2014-12-06: qty 3

## 2014-12-06 NOTE — ED Provider Notes (Signed)
CSN: [id number]     Arrival date & time 12/06/14  2030 History   First MD Initiated Contact with Patient 12/06/14 2039     Chief Complaint  Patient presents with  . Allergic Reaction     (Consider location/radiation/quality/duration/timing/severity/associated sxs/prior Treatment) HPI Jason Ali is a 74 y.o. male with past medical history of hyperlipidemia, BPH, paroxysmal atrial fibrillation on Xarelto, hypothyroidism and hypertension presenting today with an allergic reaction. Patient states he has some right bee on his left hand in approximately 7:05 PM. This is having to him before which caused anaphylaxis. He did not give himself the EpiPen although he brought it with him. He has had welts, a new rash, and diffuse pruritus. He denies any shortness of breath, wheezing, swelling in the throat. Patient currently has no further complaints.  10 Systems reviewed and are negative for acute change except as noted in the HPI.    Past Medical History  Diagnosis Date  . Multinodular goiter   . Hyperlipidemia   . BPH (benign prostatic hyperplasia)   . Tinnitus   . Paroxysmal atrial fibrillation   . Hypothyroidism   . PONV (postoperative nausea and vomiting)     eggs  . Wears glasses   . Dysrhythmia   . Hypertension    Past Surgical History  Procedure Laterality Date  . Achilles tendon repair  1991    left  . Cataract extraction  2004    both  . Tonsillectomy and adenoidectomy  1943    at age 84 and repeat at age 64  . Colonoscopy    . Knee arthroscopy with medial menisectomy Left 05/13/2014    Procedure: KNEE ARTHROSCOPY WITH MEDIAL MENISECTOMY;  Surgeon: Hessie Dibble, MD;  Location: [facility];  Service: Orthopedics;  Laterality: Left;  . Chondroplasty Left 05/13/2014    Procedure: CHONDROPLASTY;  Surgeon: Hessie Dibble, MD;  Location: [facility];  Service: Orthopedics;  Laterality: Left;   Family History  Problem Relation Age of Onset   . Colon cancer Father 63   History  Substance Use Topics  . Smoking status: Former Smoker    Quit date: 01/15/1965  . Smokeless tobacco: Never Used  . Alcohol Use: 2.4 oz/week    4 Glasses of wine per week     Comment: occasional red wine 3 to 4 x per week    Review of Systems    Allergies  Bee venom; Eggs or egg-derived products; and Latex  Home Medications   Prior to Admission medications   Medication Sig Start Date End Date Taking? Authorizing Provider  BIOTIN PO Take 1 tablet by mouth daily.   Yes Historical Provider, MD  BYSTOLIC 5 MG tablet TAKE 1 TABLET BY MOUTH DAILY 06/13/14  Yes Thompson Grayer, MD  Cholecalciferol (VITAMIN D3) 2000 UNITS TABS Take 2,000 Units by mouth daily.    Yes Historical Provider, MD  EPIPEN 2-PAK 0.3 MG/0.3ML SOAJ injection Inject 0.3 mg into the muscle daily as needed. Allergic reaction 01/23/14  Yes Historical Provider, MD  glucosamine-chondroitin 500-400 MG tablet Take 3 tablets by mouth daily.    Yes Historical Provider, MD  levothyroxine (SYNTHROID, LEVOTHROID) 125 MCG tablet Take 125 mcg by mouth daily.   Yes Historical Provider, MD  Multiple Vitamin (MULTIVITAMIN) tablet Take 1 tablet by mouth daily.   Yes Historical Provider, MD  omega-3 acid ethyl esters (LOVAZA) 1 G capsule Take 2 g by mouth 2 (two) times daily.   Yes Historical  Provider, MD  rivaroxaban (XARELTO) 20 MG TABS tablet Take 1 tablet (20 mg total) by mouth daily with supper. 03/17/14  Yes Thompson Grayer, MD  Triprolidine-Pseudoephedrine (ANTIHISTAMINE PO) Take 1 tablet by mouth once.   Yes Historical Provider, MD  vitamin C (ASCORBIC ACID) 500 MG tablet Take 500 mg by mouth daily.   Yes Historical Provider, MD  HYDROcodone-acetaminophen (NORCO) 5-325 MG per tablet Take 1-2 tablets by mouth every 6 (six) hours as needed for moderate pain. Patient not taking: Reported on 12/06/2014 05/13/14   Loni Dolly, PA-C  traMADol (ULTRAM) 50 MG tablet Take 1 tablet (50 mg total) by mouth every  6 (six) hours as needed. Patient not taking: Reported on 12/06/2014 04/28/14   Kaitlyn Szekalski, PA-C   BP 149/74 mmHg  Pulse 56  Temp(Src) 97.9 F (36.6 C) (Oral)  Resp 20  SpO2 98% Physical Exam  Constitutional: He is oriented to person, place, and time. Vital signs are normal. He appears well-developed and well-nourished.  Non-toxic appearance. He does not appear ill. No distress.  HENT:  Head: Normocephalic and atraumatic.  Nose: Nose normal.  Mouth/Throat: Oropharynx is clear and moist. No oropharyngeal exudate.  No Oropharyngeal swelling or uvular deviation.  Eyes: Conjunctivae and EOM are normal. Pupils are equal, round, and reactive to light. No scleral icterus.  Neck: Normal range of motion. Neck supple. No tracheal deviation, no edema, no erythema and normal range of motion present. No thyroid mass and no thyromegaly present.  Cardiovascular: Normal rate, regular rhythm, S1 normal, S2 normal, normal heart sounds, intact distal pulses and normal pulses.  Exam reveals no gallop and no friction rub.   No murmur heard. Pulses:      Radial pulses are 2+ on the right side, and 2+ on the left side.       Dorsalis pedis pulses are 2+ on the right side, and 2+ on the left side.  Pulmonary/Chest: Effort normal and breath sounds normal. No respiratory distress. He has no wheezes. He has no rhonchi. He has no rales.  Abdominal: Soft. Normal appearance and bowel sounds are normal. He exhibits no distension, no ascites and no mass. There is no hepatosplenomegaly. There is no tenderness. There is no rebound, no guarding and no CVA tenderness.  Musculoskeletal: Normal range of motion. He exhibits no edema or tenderness.  Lymphadenopathy:    He has no cervical adenopathy.  Neurological: He is alert and oriented to person, place, and time. He has normal strength. No cranial nerve deficit or sensory deficit.  Skin: Skin is warm, dry and intact. Rash noted. No petechiae noted. He is not  diaphoretic. There is erythema. No pallor.  Small punctate lesion seen on L hand, corresponding with bee sting.  Diffuse rash seen on abdomen with welts.  Patient is actively having pruritis as well.  Psychiatric: He has a normal mood and affect. His behavior is normal. Judgment normal.  Nursing note and vitals reviewed.   ED Course  Procedures (including critical care time) Labs Review Labs Reviewed - No data to display  Imaging Review No results found.   EKG Interpretation None      MDM   Final diagnoses:  None   Patient presents to the ED for an allergic reaction.  This is likely due to the bee sting.  He was given IM epi, prednisone, ranitidine and benadryl for relief.  He will be monitored in the ED for at least 2 hours for any worsening of his condition.  Currently  he is denying any airway symptoms.    Upon repeat evaluation the patient, his pruritus has resolved. The rash on his abdomen is also vastly improved. I no longer visualize any welts.  Patient continues to not have any airway complications or shortness of breath. He's been observed in the emergency department for 2 hours after epi given.  No rebound reactions noted.  He will be DC with 4 day course of prednisone.  He has epi pen at home already.  PCP fu was advised within 3 days. Return precautions given.  VS remain within his normal limits and he is safe for DC.  Everlene Balls, MD 12/06/14 2233

## 2014-12-06 NOTE — ED Notes (Signed)
Pt states itching has decreased some, continues to deny swelling or itching in throat area,  Right ear itches slightly

## 2014-12-06 NOTE — ED Notes (Signed)
Pt stood up to urinate, became slightly light headed but no incidence

## 2014-12-06 NOTE — Discharge Instructions (Signed)
Anaphylactic Reaction Jason Ali, continue to take prednisone for the next 4 days for your symptoms.  See your primary physician within 3 days for close follow up.  If any symptoms return or worsen, come back to the ED immediately. Thank you. An anaphylactic reaction is a sudden, severe allergic reaction. It affects the whole body. It can be life threatening. You may need to stay in the hospital.  [place] a medical bracelet or necklace that lists your allergy.  Carry your allergy kit or medicine shot to treat severe allergic reactions with you. These can save your life.  Do not drive until medicine from your shot has worn off, unless your doctor says it is okay.  If you have hives or a rash:  Take medicine as told by your doctor.  You may take over-the-counter antihistamine medicine.  Place cold cloths on your skin. Take baths in cool water. Avoid hot baths and hot showers. GET HELP RIGHT AWAY IF:   Your mouth is puffy (swollen), or you have trouble breathing.  You start making whistling sounds when you breathe (wheezing).  You have a tight feeling in your chest or throat.  You have a rash, hives, puffiness, or itching on your body.  You throw up (vomit) or have watery poop (diarrhea).  You feel dizzy or pass out (faint).  You think you are having an allergic reaction.  You have new symptoms. This is an emergency. Use your medicine shot or allergy kit as told. Call your local emergency services (911 in [place] Even if you feel better after the shot, you need to go to the hospital emergency department. MAKE SURE YOU:   Understand these instructions.  Will watch your condition.  Will get help right away if you are not doing well or get worse. Document Released: 12/28/2007 Document Revised: 01/10/2012 Document Reviewed: 10/12/2011 [facility] Patient Information 2015 [place], [place]. This information is not intended to replace advice given to you by your health care  provider. Make sure you discuss any questions you have with your health care provider.

## 2014-12-06 NOTE — ED Notes (Signed)
Pt states be got bee stung about 705 pm,  One sting to left hand

## 2014-12-06 NOTE — ED Notes (Signed)
Redness has started disappear,  Itching cleared in right ear,  Pt states he feels much better

## 2015-01-07 DIAGNOSIS — B354 Tinea corporis: Secondary | ICD-10-CM | POA: Diagnosis not present

## 2015-01-07 DIAGNOSIS — B351 Tinea unguium: Secondary | ICD-10-CM | POA: Diagnosis not present

## 2015-02-09 DIAGNOSIS — H903 Sensorineural hearing loss, bilateral: Secondary | ICD-10-CM | POA: Diagnosis not present

## 2015-02-09 DIAGNOSIS — H6123 Impacted cerumen, bilateral: Secondary | ICD-10-CM | POA: Diagnosis not present

## 2015-02-09 DIAGNOSIS — H9313 Tinnitus, bilateral: Secondary | ICD-10-CM | POA: Diagnosis not present

## 2015-02-10 ENCOUNTER — Other Ambulatory Visit: Payer: Self-pay | Admitting: Otolaryngology

## 2015-02-10 DIAGNOSIS — H903 Sensorineural hearing loss, bilateral: Secondary | ICD-10-CM

## 2015-02-10 DIAGNOSIS — H9313 Tinnitus, bilateral: Secondary | ICD-10-CM

## 2015-02-12 DIAGNOSIS — E039 Hypothyroidism, unspecified: Secondary | ICD-10-CM | POA: Diagnosis not present

## 2015-02-12 DIAGNOSIS — R7301 Impaired fasting glucose: Secondary | ICD-10-CM | POA: Diagnosis not present

## 2015-02-12 DIAGNOSIS — Z125 Encounter for screening for malignant neoplasm of prostate: Secondary | ICD-10-CM | POA: Diagnosis not present

## 2015-02-18 DIAGNOSIS — H9319 Tinnitus, unspecified ear: Secondary | ICD-10-CM | POA: Diagnosis not present

## 2015-02-18 DIAGNOSIS — Z0000 Encounter for general adult medical examination without abnormal findings: Secondary | ICD-10-CM | POA: Diagnosis not present

## 2015-02-18 DIAGNOSIS — N400 Enlarged prostate without lower urinary tract symptoms: Secondary | ICD-10-CM | POA: Diagnosis not present

## 2015-02-18 DIAGNOSIS — Z1389 Encounter for screening for other disorder: Secondary | ICD-10-CM | POA: Diagnosis not present

## 2015-02-18 DIAGNOSIS — H33009 Unspecified retinal detachment with retinal break, unspecified eye: Secondary | ICD-10-CM | POA: Diagnosis not present

## 2015-02-18 DIAGNOSIS — E039 Hypothyroidism, unspecified: Secondary | ICD-10-CM | POA: Diagnosis not present

## 2015-02-18 DIAGNOSIS — R7301 Impaired fasting glucose: Secondary | ICD-10-CM | POA: Diagnosis not present

## 2015-02-18 DIAGNOSIS — Z6826 Body mass index (BMI) 26.0-26.9, adult: Secondary | ICD-10-CM | POA: Diagnosis not present

## 2015-02-18 DIAGNOSIS — I480 Paroxysmal atrial fibrillation: Secondary | ICD-10-CM | POA: Diagnosis not present

## 2015-02-20 DIAGNOSIS — Z1212 Encounter for screening for malignant neoplasm of rectum: Secondary | ICD-10-CM | POA: Diagnosis not present

## 2015-02-26 ENCOUNTER — Ambulatory Visit
Admission: RE | Admit: 2015-02-26 | Discharge: 2015-02-26 | Disposition: A | Discharge status: Home / Self Care | Payer: Medicare Other | Source: Ambulatory Visit | Attending: Otolaryngology | Admitting: Otolaryngology

## 2015-02-26 DIAGNOSIS — H9313 Tinnitus, bilateral: Secondary | ICD-10-CM | POA: Diagnosis not present

## 2015-02-26 DIAGNOSIS — H9192 Unspecified hearing loss, left ear: Secondary | ICD-10-CM | POA: Diagnosis not present

## 2015-02-26 DIAGNOSIS — H903 Sensorineural hearing loss, bilateral: Secondary | ICD-10-CM

## 2015-02-26 MED ORDER — GADOBENATE DIMEGLUMINE 529 MG/ML IV SOLN
15.0000 mL | Freq: Once | INTRAVENOUS | Status: AC | PRN
Start: 2015-02-26 — End: 2015-02-26
  Administered 2015-02-26: 15 mL via INTRAVENOUS

## 2015-03-03 DIAGNOSIS — L821 Other seborrheic keratosis: Secondary | ICD-10-CM | POA: Diagnosis not present

## 2015-03-03 DIAGNOSIS — B351 Tinea unguium: Secondary | ICD-10-CM | POA: Diagnosis not present

## 2015-03-03 DIAGNOSIS — L570 Actinic keratosis: Secondary | ICD-10-CM | POA: Diagnosis not present

## 2015-03-03 DIAGNOSIS — D1801 Hemangioma of skin and subcutaneous tissue: Secondary | ICD-10-CM | POA: Diagnosis not present

## 2015-03-03 DIAGNOSIS — D225 Melanocytic nevi of trunk: Secondary | ICD-10-CM | POA: Diagnosis not present

## 2015-03-09 ENCOUNTER — Other Ambulatory Visit: Payer: Self-pay | Admitting: Internal Medicine

## 2015-04-01 ENCOUNTER — Other Ambulatory Visit: Payer: Self-pay | Admitting: Internal Medicine

## 2015-04-07 DIAGNOSIS — H43813 Vitreous degeneration, bilateral: Secondary | ICD-10-CM | POA: Diagnosis not present

## 2015-04-07 DIAGNOSIS — H35373 Puckering of macula, bilateral: Secondary | ICD-10-CM | POA: Diagnosis not present

## 2015-04-13 ENCOUNTER — Ambulatory Visit (INDEPENDENT_AMBULATORY_CARE_PROVIDER_SITE_OTHER): Payer: Medicare Other | Admitting: Internal Medicine

## 2015-04-13 ENCOUNTER — Encounter: Payer: Self-pay | Admitting: Internal Medicine

## 2015-04-13 VITALS — BP 120/72 | HR 51 | Ht 70.0 in | Wt 180.1 lb

## 2015-04-13 DIAGNOSIS — I4891 Unspecified atrial fibrillation: Secondary | ICD-10-CM | POA: Diagnosis not present

## 2015-04-13 MED ORDER — NEBIVOLOL HCL 5 MG PO TABS
2.5000 mg | ORAL_TABLET | Freq: Every day | ORAL | Status: DC
Start: 2015-04-13 — End: 2016-05-05

## 2015-04-13 NOTE — Progress Notes (Signed)
PCP: Haywood Pao, MD  Jason Ali is a 74 y.o. male who presents today for routine electrophysiology followup.  Since last being seen in our clinic, the patient reports doing very well.  He remains very acive.  Today, he denies symptoms of palpitations, chest pain, shortness of breath,  lower extremity edema, presyncope, or syncope.  He has rare dizziness when he bends over.  The patient is otherwise without complaint today.   Past Medical History  Diagnosis Date  . Multinodular goiter   . Hyperlipidemia   . BPH (benign prostatic hyperplasia)   . Tinnitus   . Paroxysmal atrial fibrillation   . Hypothyroidism   . PONV (postoperative nausea and vomiting)     eggs  . Wears glasses   . Dysrhythmia   . Hypertension    Past Surgical History  Procedure Laterality Date  . Achilles tendon repair  1991    left  . Cataract extraction  2004    both  . Tonsillectomy and adenoidectomy  1943    at age 74 and repeat at age 65  . Colonoscopy    . Knee arthroscopy with medial menisectomy Left 05/13/2014    Procedure: KNEE ARTHROSCOPY WITH MEDIAL MENISECTOMY;  Surgeon: Hessie Dibble, MD;  Location: [facility];  Service: Orthopedics;  Laterality: Left;  . Chondroplasty Left 05/13/2014    Procedure: CHONDROPLASTY;  Surgeon: Hessie Dibble, MD;  Location: [facility];  Service: Orthopedics;  Laterality: Left;    Current Outpatient Prescriptions  Medication Sig Dispense Refill  . BIOTIN PO Take 1 tablet by mouth daily.    . Cholecalciferol (VITAMIN D3) 2000 UNITS TABS Take 2,000 Units by mouth daily.     Marland Kitchen EPIPEN 2-PAK 0.3 MG/0.3ML SOAJ injection Inject 0.3 mg into the muscle daily as needed. Allergic reaction    . glucosamine-chondroitin 500-400 MG tablet Take 3 tablets by mouth daily.     Marland Kitchen levothyroxine (SYNTHROID, LEVOTHROID) 125 MCG tablet Take 125 mcg by mouth daily.    . Multiple Vitamin (MULTIVITAMIN) tablet Take 1 tablet by mouth daily.    .  nebivolol (BYSTOLIC) 5 MG tablet Take 0.5 tablets (2.5 mg total) by mouth daily.    Marland Kitchen omega-3 acid ethyl esters (LOVAZA) 1 G capsule Take 2 g by mouth 2 (two) times daily.    . vitamin C (ASCORBIC ACID) 500 MG tablet Take 500 mg by mouth daily.    Alveda Reasons 20 MG TABS tablet TAKE 1 TABLET BY MOUTH DAILY WITH SUPPER 30 tablet 0   No current facility-administered medications for this visit.    Physical Exam: Filed Vitals:   04/13/15 1554  BP: 120/72  Pulse: 51  Height: 5\' 10"  (1.778 m)  Weight: 180 lb 1.9 oz (81.702 kg)    GEN- The patient is well appearing, alert and oriented x 3 today.   Head- normocephalic, atraumatic Eyes-  Sclera clear, conjunctiva pink Ears- hearing intact Oropharynx- clear Lungs- Clear to ausculation bilaterally, normal work of breathing Heart- Regular rate and rhythm, no murmurs, rubs or gallops, PMI not laterally displaced GI- soft, NT, ND, + BS Extremities- no clubbing, cyanosis, or edema  ekg today reveals sinus bradycardia 51 bpm  Assessment and Plan:  1. afib Doing very well with rate control  CHADS2vasc score is 2.  Continue on xarelto Reduce bystolic to 2.5mg  daily at this time Repeat echo in 1 year upon return  2. HL Per Dr Osborne Casco.  Return in 1 year

## 2015-04-13 NOTE — Patient Instructions (Addendum)
Medication Instructions:  Your physician has recommended you make the following change in your medication:  1) Decrease Bystolic to 2.5 mg daily   Labwork: None ordered  Testing/Procedures: None ordered  Follow-Up: Your physician wants you to follow-up in: 12 months with Dr Vallery Ridge will receive a reminder letter in the mail two months in advance. If you don't receive a letter, please call our office to schedule the follow-up appointment.   Any Other Special Instructions Will Be Listed Below (If Applicable).

## 2015-05-02 ENCOUNTER — Other Ambulatory Visit: Payer: Self-pay | Admitting: Internal Medicine

## 2015-06-09 ENCOUNTER — Telehealth: Payer: Self-pay | Admitting: Internal Medicine

## 2015-06-09 ENCOUNTER — Telehealth: Payer: Self-pay

## 2015-06-09 NOTE — Telephone Encounter (Signed)
Pt calling stating that his Pharmacy stated that he needs a prior auth before he can get his Eliquis 20 mg tablet refilled. Please advise

## 2015-06-09 NOTE — Telephone Encounter (Signed)
Prior auth for Xarelto 20mg sent to [facility]. 

## 2015-06-09 NOTE — Telephone Encounter (Signed)
Left message for patient. PA initiated.

## 2015-06-10 ENCOUNTER — Telehealth: Payer: Self-pay

## 2015-06-10 NOTE — Telephone Encounter (Signed)
Approval obtained for Xarelto 20mg . [id number]. Good for 1 year.

## 2015-08-05 ENCOUNTER — Other Ambulatory Visit: Payer: Self-pay | Admitting: Internal Medicine

## 2015-08-20 DIAGNOSIS — Z6826 Body mass index (BMI) 26.0-26.9, adult: Secondary | ICD-10-CM | POA: Diagnosis not present

## 2015-08-20 DIAGNOSIS — J019 Acute sinusitis, unspecified: Secondary | ICD-10-CM | POA: Diagnosis not present

## 2015-09-01 DIAGNOSIS — Z6825 Body mass index (BMI) 25.0-25.9, adult: Secondary | ICD-10-CM | POA: Diagnosis not present

## 2015-09-01 DIAGNOSIS — J209 Acute bronchitis, unspecified: Secondary | ICD-10-CM | POA: Diagnosis not present

## 2015-09-01 DIAGNOSIS — R05 Cough: Secondary | ICD-10-CM | POA: Diagnosis not present

## 2015-09-01 DIAGNOSIS — J019 Acute sinusitis, unspecified: Secondary | ICD-10-CM | POA: Diagnosis not present

## 2015-09-02 ENCOUNTER — Other Ambulatory Visit: Payer: Self-pay | Admitting: Internal Medicine

## 2015-09-29 DIAGNOSIS — Z09 Encounter for follow-up examination after completed treatment for conditions other than malignant neoplasm: Secondary | ICD-10-CM | POA: Diagnosis not present

## 2015-09-29 DIAGNOSIS — Z8709 Personal history of other diseases of the respiratory system: Secondary | ICD-10-CM | POA: Diagnosis not present

## 2015-10-08 DIAGNOSIS — H35369 Drusen (degenerative) of macula, unspecified eye: Secondary | ICD-10-CM | POA: Diagnosis not present

## 2015-10-08 DIAGNOSIS — Z961 Presence of intraocular lens: Secondary | ICD-10-CM | POA: Diagnosis not present

## 2015-10-08 DIAGNOSIS — H26492 Other secondary cataract, left eye: Secondary | ICD-10-CM | POA: Diagnosis not present

## 2015-10-08 DIAGNOSIS — H35373 Puckering of macula, bilateral: Secondary | ICD-10-CM | POA: Diagnosis not present

## 2016-02-19 DIAGNOSIS — Z125 Encounter for screening for malignant neoplasm of prostate: Secondary | ICD-10-CM | POA: Diagnosis not present

## 2016-02-19 DIAGNOSIS — E038 Other specified hypothyroidism: Secondary | ICD-10-CM | POA: Diagnosis not present

## 2016-02-19 DIAGNOSIS — R7301 Impaired fasting glucose: Secondary | ICD-10-CM | POA: Diagnosis not present

## 2016-02-26 DIAGNOSIS — Z1389 Encounter for screening for other disorder: Secondary | ICD-10-CM | POA: Diagnosis not present

## 2016-02-26 DIAGNOSIS — N400 Enlarged prostate without lower urinary tract symptoms: Secondary | ICD-10-CM | POA: Diagnosis not present

## 2016-02-26 DIAGNOSIS — I480 Paroxysmal atrial fibrillation: Secondary | ICD-10-CM | POA: Diagnosis not present

## 2016-02-26 DIAGNOSIS — D692 Other nonthrombocytopenic purpura: Secondary | ICD-10-CM | POA: Diagnosis not present

## 2016-02-26 DIAGNOSIS — Z7901 Long term (current) use of anticoagulants: Secondary | ICD-10-CM | POA: Diagnosis not present

## 2016-02-26 DIAGNOSIS — H9319 Tinnitus, unspecified ear: Secondary | ICD-10-CM | POA: Diagnosis not present

## 2016-02-26 DIAGNOSIS — Z0000 Encounter for general adult medical examination without abnormal findings: Secondary | ICD-10-CM | POA: Diagnosis not present

## 2016-02-26 DIAGNOSIS — R7301 Impaired fasting glucose: Secondary | ICD-10-CM | POA: Diagnosis not present

## 2016-02-26 DIAGNOSIS — E038 Other specified hypothyroidism: Secondary | ICD-10-CM | POA: Diagnosis not present

## 2016-02-26 DIAGNOSIS — Z6826 Body mass index (BMI) 26.0-26.9, adult: Secondary | ICD-10-CM | POA: Diagnosis not present

## 2016-02-26 DIAGNOSIS — H33009 Unspecified retinal detachment with retinal break, unspecified eye: Secondary | ICD-10-CM | POA: Diagnosis not present

## 2016-03-01 DIAGNOSIS — Z1212 Encounter for screening for malignant neoplasm of rectum: Secondary | ICD-10-CM | POA: Diagnosis not present

## 2016-03-02 DIAGNOSIS — L570 Actinic keratosis: Secondary | ICD-10-CM | POA: Diagnosis not present

## 2016-03-02 DIAGNOSIS — L814 Other melanin hyperpigmentation: Secondary | ICD-10-CM | POA: Diagnosis not present

## 2016-03-02 DIAGNOSIS — D0462 Carcinoma in situ of skin of left upper limb, including shoulder: Secondary | ICD-10-CM | POA: Diagnosis not present

## 2016-03-02 DIAGNOSIS — L821 Other seborrheic keratosis: Secondary | ICD-10-CM | POA: Diagnosis not present

## 2016-03-02 DIAGNOSIS — D225 Melanocytic nevi of trunk: Secondary | ICD-10-CM | POA: Diagnosis not present

## 2016-03-02 DIAGNOSIS — D1801 Hemangioma of skin and subcutaneous tissue: Secondary | ICD-10-CM | POA: Diagnosis not present

## 2016-03-02 DIAGNOSIS — B354 Tinea corporis: Secondary | ICD-10-CM | POA: Diagnosis not present

## 2016-03-30 ENCOUNTER — Other Ambulatory Visit: Payer: Self-pay | Admitting: Internal Medicine

## 2016-04-21 DIAGNOSIS — Z961 Presence of intraocular lens: Secondary | ICD-10-CM | POA: Diagnosis not present

## 2016-04-21 DIAGNOSIS — H35363 Drusen (degenerative) of macula, bilateral: Secondary | ICD-10-CM | POA: Diagnosis not present

## 2016-04-21 DIAGNOSIS — H35373 Puckering of macula, bilateral: Secondary | ICD-10-CM | POA: Diagnosis not present

## 2016-04-29 ENCOUNTER — Other Ambulatory Visit: Payer: Self-pay | Admitting: *Deleted

## 2016-04-29 MED ORDER — RIVAROXABAN 20 MG PO TABS: 20.0000 mg | ORAL_TABLET | Freq: Every day | ORAL | 0 refills | Status: DC

## 2016-05-05 ENCOUNTER — Other Ambulatory Visit: Payer: Self-pay

## 2016-05-05 ENCOUNTER — Encounter: Payer: Self-pay | Admitting: Internal Medicine

## 2016-05-05 ENCOUNTER — Ambulatory Visit (INDEPENDENT_AMBULATORY_CARE_PROVIDER_SITE_OTHER): Payer: Medicare Other | Admitting: Internal Medicine

## 2016-05-05 VITALS — BP 126/90 | HR 79 | Ht 70.0 in | Wt 182.0 lb

## 2016-05-05 DIAGNOSIS — I480 Paroxysmal atrial fibrillation: Secondary | ICD-10-CM

## 2016-05-05 DIAGNOSIS — I4891 Unspecified atrial fibrillation: Secondary | ICD-10-CM

## 2016-05-05 DIAGNOSIS — I10 Essential (primary) hypertension: Secondary | ICD-10-CM

## 2016-05-05 NOTE — Patient Instructions (Signed)
Medication Instructions:  Your physician recommends that you continue on your current medications as directed. Please refer to the Current Medication list given to you today.   Labwork: None ordered   Testing/Procedures: Your physician has requested that you have an echocardiogram. Echocardiography is a painless test that uses sound waves to create images of your heart. It provides your doctor with information about the size and shape of your heart and how well your heart's chambers and valves are working. This procedure takes approximately one hour. There are no restrictions for this procedure.    Follow-Up: Your physician wants you to follow-up in: 12 months with Dr Vallery Ridge will receive a reminder letter in the mail two months in advance. If you don't receive a letter, please call our office to schedule the follow-up appointment.   Any Other Special Instructions Will Be Listed Below (If Applicable).     If you need a refill on your cardiac medications before your next appointment, please call your pharmacy.

## 2016-05-05 NOTE — Progress Notes (Signed)
PCP: Haywood Pao, MD  Jason Ali is a 75 y.o. male who presents today for routine electrophysiology followup.  Since last being seen in our clinic, the patient reports doing very well.  He remains very acive.  He is in afib today but unaware.  Today, he denies symptoms of palpitations, chest pain, shortness of breath,  lower extremity edema, presyncope, or syncope.  He has rare dizziness when he bends over.  The patient is otherwise without complaint today.   Past Medical History:  Diagnosis Date  . BPH (benign prostatic hyperplasia)   . Dysrhythmia   . Hyperlipidemia   . Hypertension   . Hypothyroidism   . Multinodular goiter   . Paroxysmal atrial fibrillation (HCC)   . PONV (postoperative nausea and vomiting)    eggs  . Tinnitus   . Wears glasses    Past Surgical History:  Procedure Laterality Date  . [place]   left  . CATARACT EXTRACTION  2004   both  . CHONDROPLASTY Left 05/13/2014   Procedure: CHONDROPLASTY;  Surgeon: Hessie Dibble, MD;  Location: [place];  Service: Orthopedics;  Laterality: Left;  . COLONOSCOPY    . KNEE ARTHROSCOPY WITH MEDIAL MENISECTOMY Left 05/13/2014   Procedure: KNEE ARTHROSCOPY WITH MEDIAL MENISECTOMY;  Surgeon: Hessie Dibble, MD;  Location: [facility];  Service: Orthopedics;  Laterality: Left;  . TONSILLECTOMY AND ADENOIDECTOMY  1943   at age 72 and repeat at age 88    Current Outpatient Prescriptions  Medication Sig Dispense Refill  . BIOTIN PO Take 1 tablet by mouth daily.    . Cholecalciferol (VITAMIN D3) 2000 UNITS TABS Take 2,000 Units by mouth daily.     Marland Kitchen EPIPEN 2-PAK 0.3 MG/0.3ML SOAJ injection Inject 0.3 mg into the muscle daily as needed. Allergic reaction    . fluconazole (DIFLUCAN) 200 MG tablet Take 1 tablet by mouth once a week.    Marland Kitchen glucosamine-chondroitin 500-400 MG tablet Take 3 tablets by mouth daily.     Marland Kitchen levothyroxine (SYNTHROID, LEVOTHROID) 125 MCG  tablet Take 125 mcg by mouth daily.    . Multiple Vitamin (MULTIVITAMIN) tablet Take 1 tablet by mouth daily.    . nebivolol (BYSTOLIC) 5 MG tablet Take 0.5 tablets (2.5 mg total) by mouth daily. 45 tablet 2  . omega-3 acid ethyl esters (LOVAZA) 1 G capsule Take 2 g by mouth 2 (two) times daily.    . rivaroxaban (XARELTO) 20 MG TABS tablet Take 1 tablet (20 mg total) by mouth daily with supper. 30 tablet 0  . vitamin C (ASCORBIC ACID) 500 MG tablet Take 500 mg by mouth daily.     No current facility-administered medications for this visit.     Physical Exam: Vitals:   05/05/16 0757  BP: 126/90  Pulse: 79  Weight: 182 lb (82.6 kg)  Height: 5\' 10"  (1.778 m)    GEN- The patient is well appearing, alert and oriented x 3 today.   Head- normocephalic, atraumatic Eyes-  Sclera clear, conjunctiva pink Ears- hearing intact Oropharynx- clear Lungs- Clear to ausculation bilaterally, normal work of breathing Heart- irregular rate and rhythm, no murmurs, rubs or gallops, PMI not laterally displaced GI- soft, NT, ND, + BS Extremities- no clubbing, cyanosis, or edema  ekg today reveals afib 79 bpm  Assessment and Plan:  1. afib Doing very well with rate control  CHADS2vasc score is 2.  Continue on xarelto  Repeat echo  at this time  2. HL Per Dr Osborne Casco.  Return in 1 year Thompson Grayer MD, [facility] 05/05/2016 8:44 AM

## 2016-05-06 ENCOUNTER — Other Ambulatory Visit: Payer: Self-pay | Admitting: Internal Medicine

## 2016-05-18 ENCOUNTER — Other Ambulatory Visit: Payer: Self-pay

## 2016-05-18 ENCOUNTER — Ambulatory Visit (HOSPITAL_COMMUNITY): Payer: Medicare Other | Attending: Cardiovascular Disease

## 2016-05-18 DIAGNOSIS — I4891 Unspecified atrial fibrillation: Secondary | ICD-10-CM | POA: Insufficient documentation

## 2016-05-27 ENCOUNTER — Other Ambulatory Visit: Payer: Self-pay | Admitting: Internal Medicine

## 2016-07-05 DIAGNOSIS — D2371 Other benign neoplasm of skin of right lower limb, including hip: Secondary | ICD-10-CM | POA: Diagnosis not present

## 2016-07-05 DIAGNOSIS — L814 Other melanin hyperpigmentation: Secondary | ICD-10-CM | POA: Diagnosis not present

## 2016-07-05 DIAGNOSIS — D2272 Melanocytic nevi of left lower limb, including hip: Secondary | ICD-10-CM | POA: Diagnosis not present

## 2016-07-05 DIAGNOSIS — D225 Melanocytic nevi of trunk: Secondary | ICD-10-CM | POA: Diagnosis not present

## 2016-07-05 DIAGNOSIS — B354 Tinea corporis: Secondary | ICD-10-CM | POA: Diagnosis not present

## 2016-07-05 DIAGNOSIS — D2372 Other benign neoplasm of skin of left lower limb, including hip: Secondary | ICD-10-CM | POA: Diagnosis not present

## 2016-07-05 DIAGNOSIS — L821 Other seborrheic keratosis: Secondary | ICD-10-CM | POA: Diagnosis not present

## 2016-07-05 DIAGNOSIS — D1801 Hemangioma of skin and subcutaneous tissue: Secondary | ICD-10-CM | POA: Diagnosis not present

## 2016-07-05 DIAGNOSIS — L570 Actinic keratosis: Secondary | ICD-10-CM | POA: Diagnosis not present

## 2016-07-05 DIAGNOSIS — L720 Epidermal cyst: Secondary | ICD-10-CM | POA: Diagnosis not present

## 2016-07-05 DIAGNOSIS — D485 Neoplasm of uncertain behavior of skin: Secondary | ICD-10-CM | POA: Diagnosis not present

## 2016-08-04 DIAGNOSIS — H00013 Hordeolum externum right eye, unspecified eyelid: Secondary | ICD-10-CM | POA: Diagnosis not present

## 2016-08-05 DIAGNOSIS — Z23 Encounter for immunization: Secondary | ICD-10-CM | POA: Diagnosis not present

## 2016-08-25 DIAGNOSIS — H00013 Hordeolum externum right eye, unspecified eyelid: Secondary | ICD-10-CM | POA: Diagnosis not present

## 2016-08-31 DIAGNOSIS — E038 Other specified hypothyroidism: Secondary | ICD-10-CM | POA: Diagnosis not present

## 2016-08-31 DIAGNOSIS — Z1389 Encounter for screening for other disorder: Secondary | ICD-10-CM | POA: Diagnosis not present

## 2016-08-31 DIAGNOSIS — I480 Paroxysmal atrial fibrillation: Secondary | ICD-10-CM | POA: Diagnosis not present

## 2016-08-31 DIAGNOSIS — H33009 Unspecified retinal detachment with retinal break, unspecified eye: Secondary | ICD-10-CM | POA: Diagnosis not present

## 2016-08-31 DIAGNOSIS — N400 Enlarged prostate without lower urinary tract symptoms: Secondary | ICD-10-CM | POA: Diagnosis not present

## 2016-08-31 DIAGNOSIS — Z7901 Long term (current) use of anticoagulants: Secondary | ICD-10-CM | POA: Diagnosis not present

## 2016-08-31 DIAGNOSIS — R7301 Impaired fasting glucose: Secondary | ICD-10-CM | POA: Diagnosis not present

## 2016-08-31 DIAGNOSIS — Z6826 Body mass index (BMI) 26.0-26.9, adult: Secondary | ICD-10-CM | POA: Diagnosis not present

## 2016-10-25 DIAGNOSIS — H35363 Drusen (degenerative) of macula, bilateral: Secondary | ICD-10-CM | POA: Diagnosis not present

## 2016-10-25 DIAGNOSIS — H3589 Other specified retinal disorders: Secondary | ICD-10-CM | POA: Diagnosis not present

## 2016-10-25 DIAGNOSIS — H35373 Puckering of macula, bilateral: Secondary | ICD-10-CM | POA: Diagnosis not present

## 2017-01-06 DIAGNOSIS — D225 Melanocytic nevi of trunk: Secondary | ICD-10-CM | POA: Diagnosis not present

## 2017-01-06 DIAGNOSIS — L237 Allergic contact dermatitis due to plants, except food: Secondary | ICD-10-CM | POA: Diagnosis not present

## 2017-01-06 DIAGNOSIS — L570 Actinic keratosis: Secondary | ICD-10-CM | POA: Diagnosis not present

## 2017-01-06 DIAGNOSIS — D2372 Other benign neoplasm of skin of left lower limb, including hip: Secondary | ICD-10-CM | POA: Diagnosis not present

## 2017-01-06 DIAGNOSIS — D1801 Hemangioma of skin and subcutaneous tissue: Secondary | ICD-10-CM | POA: Diagnosis not present

## 2017-02-11 IMAGING — MR MR HEAD WO/W CM
10 of 11 series · 32 of 48 positions shown · IV contrast (15ml Multihance)
Comparison: None.

CLINICAL DATA: 73-year-old male with bilateral tinnitus for 20
years. Hearing loss on the left for several years. Rule out left
retrocochlear lesion. Initial encounter.

BUN and creatinine were obtained on site at [HOSPITAL] at
[HOSPITAL].
Results:  BUN 20 mg/dL,  Creatinine 1.2 mg/dL.
EXAM:
MRI HEAD WITHOUT AND WITH CONTRAST
TECHNIQUE: Multiplanar, multiecho pulse sequences of the brain and surrounding
structures were obtained without and with intravenous contrast.
CONTRAST:  15mL MULTIHANCE GADOBENATE DIMEGLUMINE 529 MG/ML IV SOLN

[Series 2: T1 · sagittal · 5.0mm · 0.45mm/px · 3 of 19 slices shown (1 of 4)]
[im 1/19]
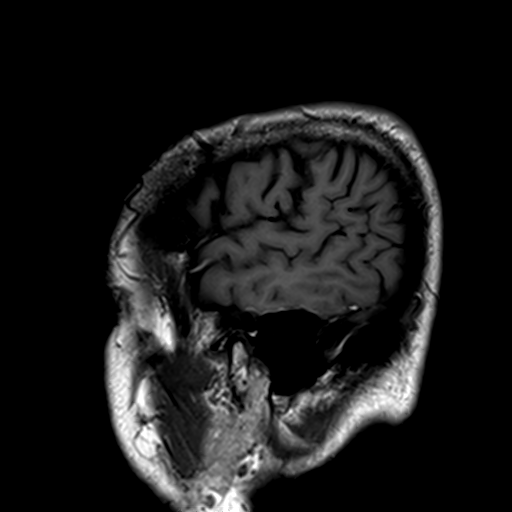
[im 10/19]
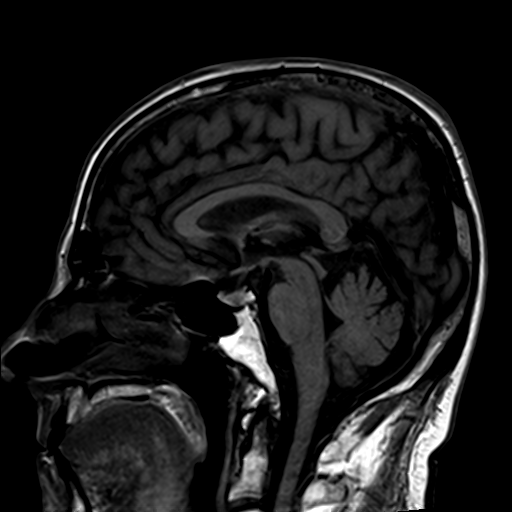
[im 19/19]
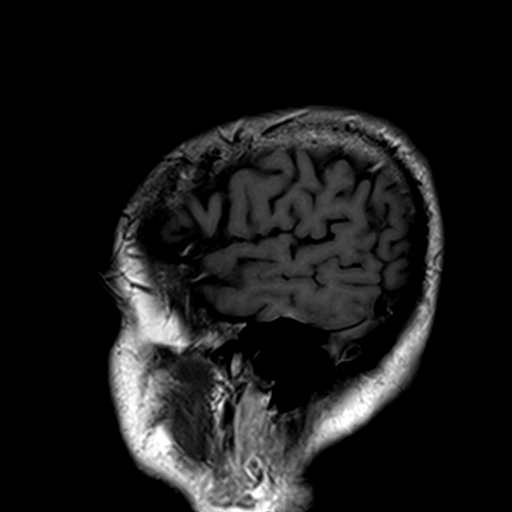

[Series 3: T2 · axial · 5.0mm · 0.45mm/px · z∈[-92,+32]mm · 4 of 21 slices shown]
[im 1/21]
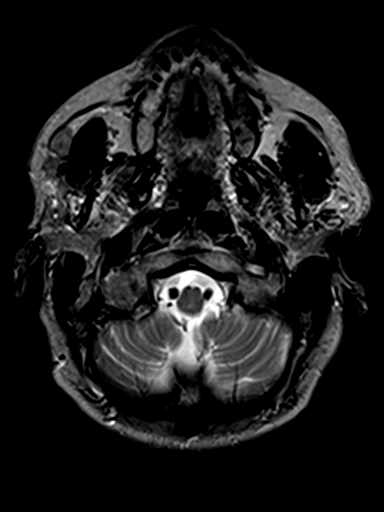
[im 7/21]
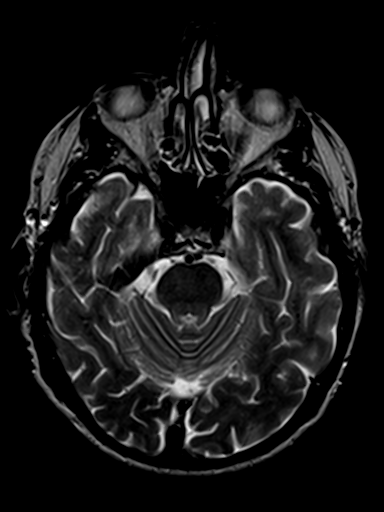
[im 14/21]
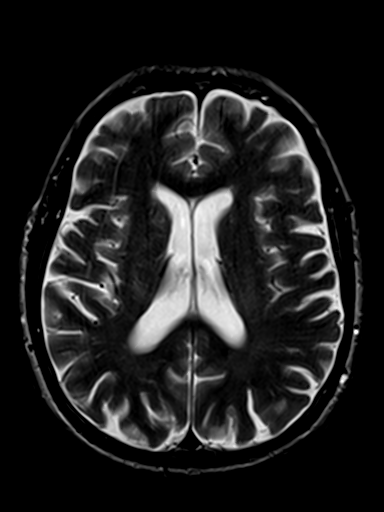
[im 21/21]
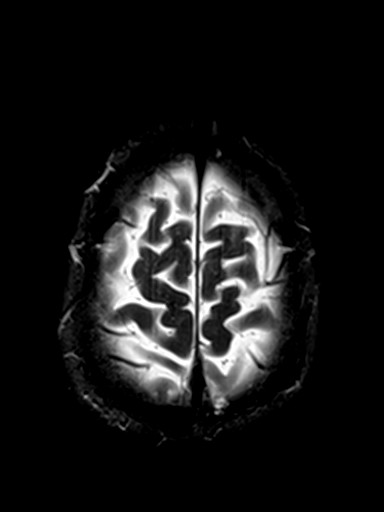

[Series 4: ep2d_diff_(id)_trace · axial · 3.0mm · 1.80mm/px · z∈[-105,+42]mm · 8 of 100 slices shown]
[im 1/100]
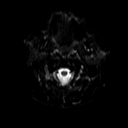
[im 17/100]
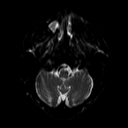
[im 34/100]
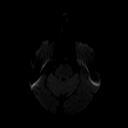
[im 42/100]
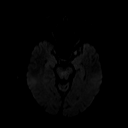
[im 58/100]
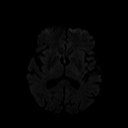
[im 67/100]
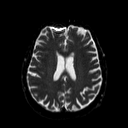
[im 83/100]
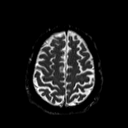
[im 100/100]
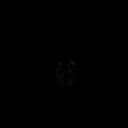

[Series 5: ep2d_diff_(id)_trace_adc · axial · 3.0mm · 1.80mm/px · z∈[-105,+42]mm · 6 of 50 slices shown]
[im 1/50]
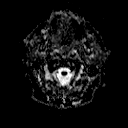
[im 10/50]
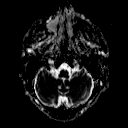
[im 20/50]
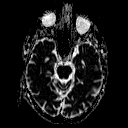
[im 30/50]
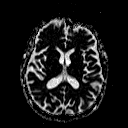
[im 40/50]
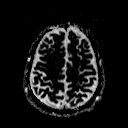
[im 50/50]
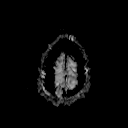

[Series 6: T1 · coronal · 3.0mm · 0.35mm/px · 1 of 11 slices shown (2 of 4)]
[im 1/11]
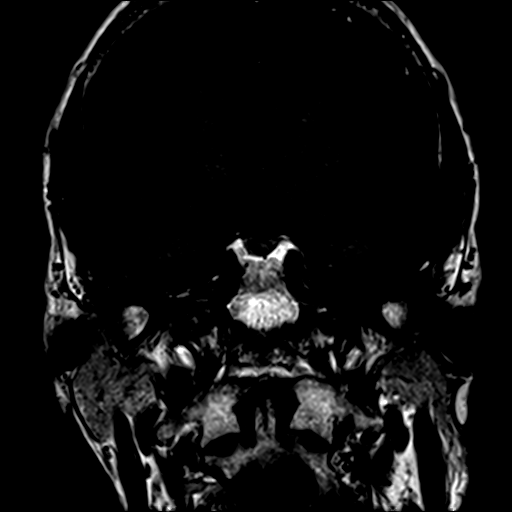

[Series 7: FLAIR · axial · 5.0mm · 0.45mm/px · z∈[-93,+31]mm · 3 of 21 slices shown]
[im 1/21]
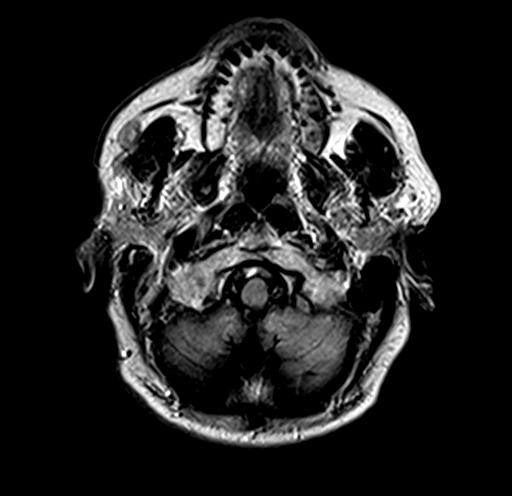
[im 11/21]
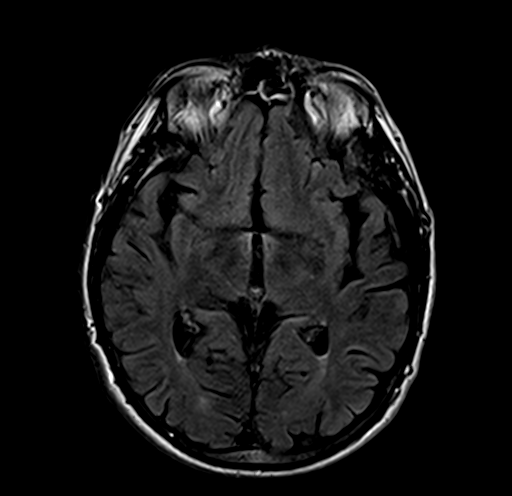
[im 21/21]
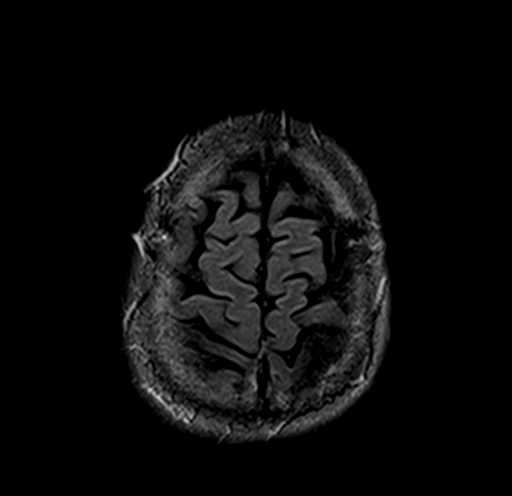

[Series 8: T1 · axial · 3.0mm · 0.35mm/px · 1 of 11 slices shown (3 of 4)]
[im 1/11]
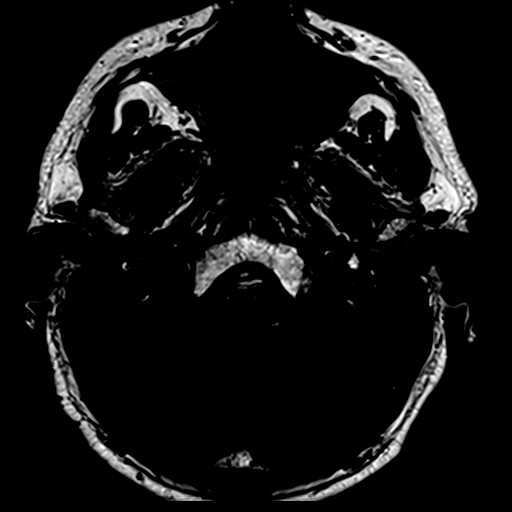

[Series 9: bSSFP · axial · 0.7mm · 0.28mm/px · z∈[-65,-47]mm · 4 of 44 slices shown]
[im 1/44]
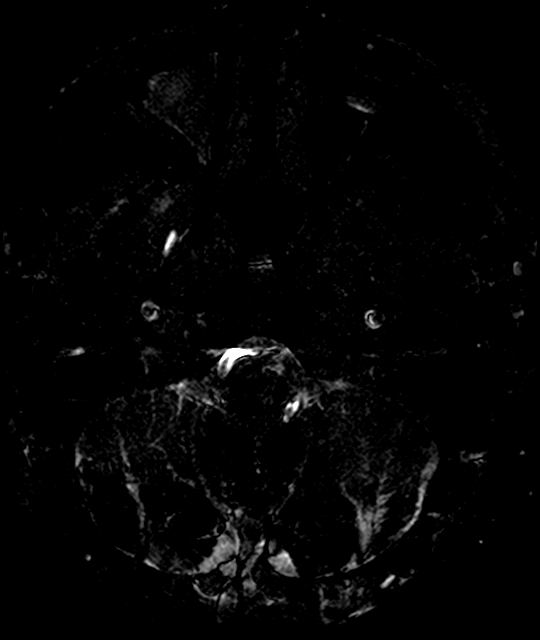
[im 9/44]
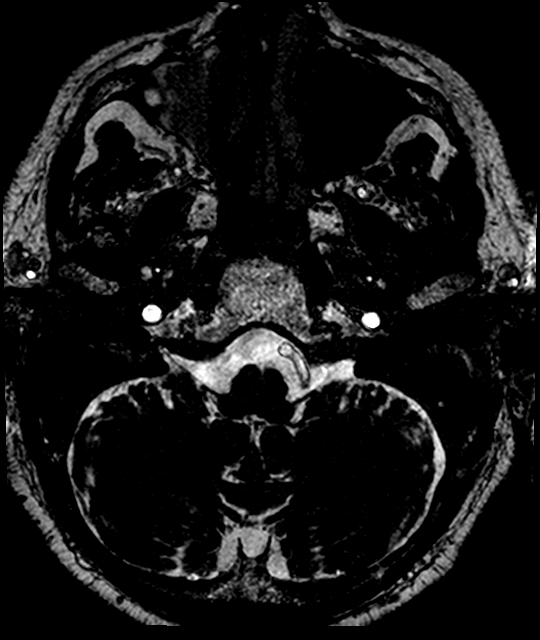
[im 18/44]
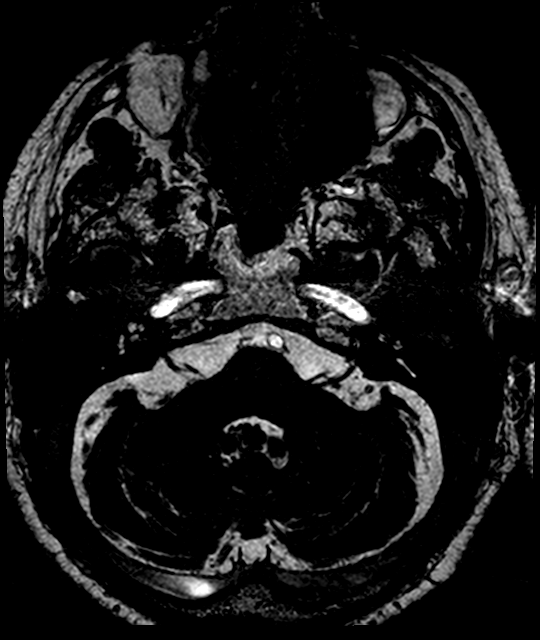
[im 26/44]
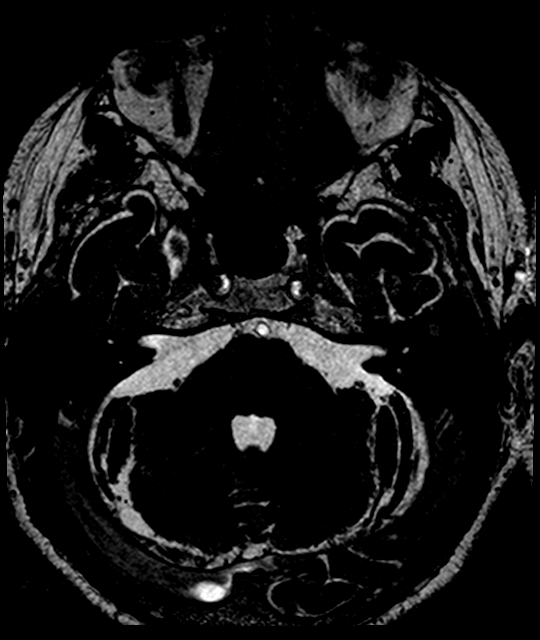

[Series 10: T1 · coronal · 3.0mm · 0.35mm/px · 1 of 11 slices shown (4 of 4)]
[im 1/11]
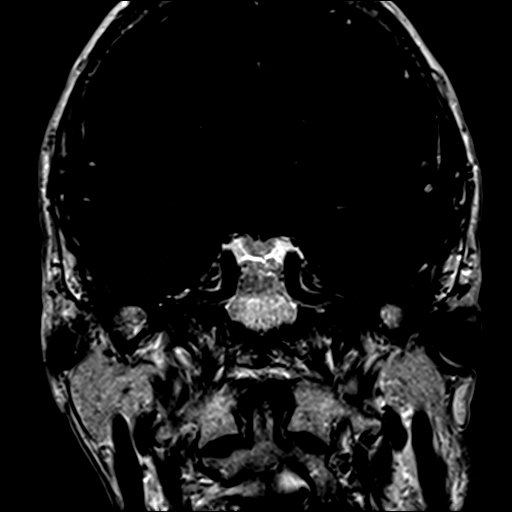

[Series 11: T1 post-contrast · axial · 3.0mm · 0.35mm/px · 1 of 11 slices shown]
[im 1/11]
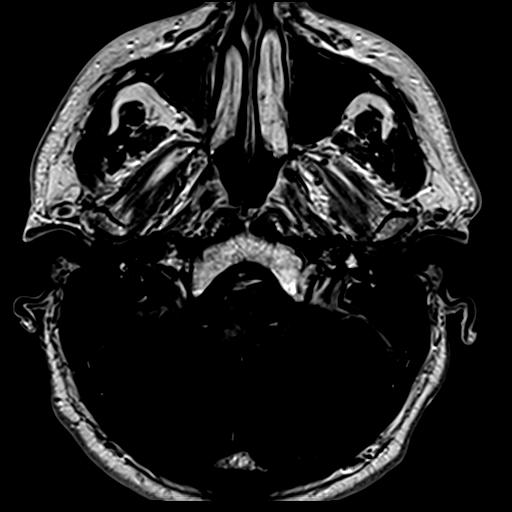

[32 of 48 positions shown; findings below may reference images not displayed]

FINDINGS: No acute infarct.

Normal symmetric labyrinthine structures. No internal auditory canal
enhancing lesion.

No hydrocephalus.

No intracranial mass or abnormal enhancement.

No obvious intracranial hemorrhage.

Major intracranial vascular structures are patent.

Complete opacification right maxillary sinus.

Post lens replacement otherwise orbital structures unremarkable.

Cervical medullary junction, pituitary region and pineal region
unremarkable.
IMPRESSION: Normal symmetric labyrinthine structures. No internal auditory canal
enhancing lesion.

Complete opacification right maxillary sinus.

## 2017-02-22 DIAGNOSIS — I480 Paroxysmal atrial fibrillation: Secondary | ICD-10-CM | POA: Diagnosis not present

## 2017-02-22 DIAGNOSIS — R7301 Impaired fasting glucose: Secondary | ICD-10-CM | POA: Diagnosis not present

## 2017-02-22 DIAGNOSIS — E038 Other specified hypothyroidism: Secondary | ICD-10-CM | POA: Diagnosis not present

## 2017-02-22 DIAGNOSIS — R972 Elevated prostate specific antigen [PSA]: Secondary | ICD-10-CM | POA: Diagnosis not present

## 2017-03-01 DIAGNOSIS — Z1389 Encounter for screening for other disorder: Secondary | ICD-10-CM | POA: Diagnosis not present

## 2017-03-01 DIAGNOSIS — N400 Enlarged prostate without lower urinary tract symptoms: Secondary | ICD-10-CM | POA: Diagnosis not present

## 2017-03-01 DIAGNOSIS — Z7901 Long term (current) use of anticoagulants: Secondary | ICD-10-CM | POA: Diagnosis not present

## 2017-03-01 DIAGNOSIS — R7301 Impaired fasting glucose: Secondary | ICD-10-CM | POA: Diagnosis not present

## 2017-03-01 DIAGNOSIS — Z6826 Body mass index (BMI) 26.0-26.9, adult: Secondary | ICD-10-CM | POA: Diagnosis not present

## 2017-03-01 DIAGNOSIS — H9313 Tinnitus, bilateral: Secondary | ICD-10-CM | POA: Diagnosis not present

## 2017-03-01 DIAGNOSIS — E038 Other specified hypothyroidism: Secondary | ICD-10-CM | POA: Diagnosis not present

## 2017-03-01 DIAGNOSIS — I480 Paroxysmal atrial fibrillation: Secondary | ICD-10-CM | POA: Diagnosis not present

## 2017-03-01 DIAGNOSIS — Z0000 Encounter for general adult medical examination without abnormal findings: Secondary | ICD-10-CM | POA: Diagnosis not present

## 2017-03-01 DIAGNOSIS — R972 Elevated prostate specific antigen [PSA]: Secondary | ICD-10-CM | POA: Diagnosis not present

## 2017-03-01 DIAGNOSIS — Z23 Encounter for immunization: Secondary | ICD-10-CM | POA: Diagnosis not present

## 2017-03-01 DIAGNOSIS — D692 Other nonthrombocytopenic purpura: Secondary | ICD-10-CM | POA: Diagnosis not present

## 2017-03-06 DIAGNOSIS — Z1212 Encounter for screening for malignant neoplasm of rectum: Secondary | ICD-10-CM | POA: Diagnosis not present

## 2017-04-25 DIAGNOSIS — J189 Pneumonia, unspecified organism: Secondary | ICD-10-CM | POA: Diagnosis not present

## 2017-04-25 DIAGNOSIS — J111 Influenza due to unidentified influenza virus with other respiratory manifestations: Secondary | ICD-10-CM | POA: Diagnosis not present

## 2017-04-25 DIAGNOSIS — Z6826 Body mass index (BMI) 26.0-26.9, adult: Secondary | ICD-10-CM | POA: Diagnosis not present

## 2017-04-25 DIAGNOSIS — J028 Acute pharyngitis due to other specified organisms: Secondary | ICD-10-CM | POA: Diagnosis not present

## 2017-04-25 DIAGNOSIS — R05 Cough: Secondary | ICD-10-CM | POA: Diagnosis not present

## 2017-04-28 ENCOUNTER — Other Ambulatory Visit: Payer: Self-pay | Admitting: *Deleted

## 2017-04-28 MED ORDER — RIVAROXABAN 20 MG PO TABS: ORAL_TABLET | ORAL | 6 refills | Status: DC

## 2017-04-28 NOTE — Telephone Encounter (Signed)
Pt last saw Dr Rayann Heman on 05/05/16, last labs in chart are from 2015.  Called pt's primary MD Dr Loren Racer office requested CBC and BMP most recent results be faxed over, had to leave message on VM. Age 76, weight 82.6kg, will await lab results to calculate CrCl to assure pt is on appropriate dosage of Xarelto 20mg  QD.

## 2017-04-28 NOTE — Telephone Encounter (Signed)
Lab results received from Dr Loren Racer office Creat 1.0 on 02/22/17.  CrCl 71.13, based on CrCl pt is on appropriate dosage of Xarelto 20mg  QD.  Will refill rx.

## 2017-05-04 ENCOUNTER — Other Ambulatory Visit: Payer: Self-pay | Admitting: Internal Medicine

## 2017-05-05 ENCOUNTER — Encounter (INDEPENDENT_AMBULATORY_CARE_PROVIDER_SITE_OTHER): Payer: Self-pay

## 2017-05-05 ENCOUNTER — Ambulatory Visit (INDEPENDENT_AMBULATORY_CARE_PROVIDER_SITE_OTHER): Payer: Medicare Other | Admitting: Internal Medicine

## 2017-05-05 ENCOUNTER — Encounter: Payer: Self-pay | Admitting: Internal Medicine

## 2017-05-05 VITALS — BP 98/62 | HR 74 | Ht 70.0 in | Wt 181.0 lb

## 2017-05-05 DIAGNOSIS — I481 Persistent atrial fibrillation: Secondary | ICD-10-CM

## 2017-05-05 DIAGNOSIS — J189 Pneumonia, unspecified organism: Secondary | ICD-10-CM | POA: Diagnosis not present

## 2017-05-05 DIAGNOSIS — I4891 Unspecified atrial fibrillation: Secondary | ICD-10-CM | POA: Diagnosis not present

## 2017-05-05 DIAGNOSIS — Z6825 Body mass index (BMI) 25.0-25.9, adult: Secondary | ICD-10-CM | POA: Diagnosis not present

## 2017-05-05 DIAGNOSIS — R05 Cough: Secondary | ICD-10-CM | POA: Diagnosis not present

## 2017-05-05 DIAGNOSIS — I4819 Other persistent atrial fibrillation: Secondary | ICD-10-CM

## 2017-05-05 NOTE — Progress Notes (Signed)
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  Since last being seen in our clinic, the patient reports doing very well.   He has been struggling with pneumonia over the past 3 weeks.  This began after water damage and repair of these damages at his old [place] home.  He has cough and SOB.  He has failed medical treatment with bactrim and Levaquin. Today, he denies symptoms of palpitations, chest pain, shortness of breath,  lower extremity edema, dizziness, presyncope, or syncope.  The patient is otherwise without complaint today.   Past Medical History:  Diagnosis Date  . BPH (benign prostatic hyperplasia)   . Hyperlipidemia   . Hypertension   . Hypothyroidism   . Multinodular goiter   . Paroxysmal atrial fibrillation (HCC)   . PONV (postoperative nausea and vomiting)    eggs  . Tinnitus   . Wears glasses    Past Surgical History:  Procedure Laterality Date  . [place]   left  . CATARACT EXTRACTION  2004   both  . CHONDROPLASTY Left 05/13/2014   Procedure: CHONDROPLASTY;  Surgeon: Hessie Dibble, MD;  Location: [place];  Service: Orthopedics;  Laterality: Left;  . COLONOSCOPY    . KNEE ARTHROSCOPY WITH MEDIAL MENISECTOMY Left 05/13/2014   Procedure: KNEE ARTHROSCOPY WITH MEDIAL MENISECTOMY;  Surgeon: Hessie Dibble, MD;  Location: [facility];  Service: Orthopedics;  Laterality: Left;  . TONSILLECTOMY AND ADENOIDECTOMY  1943   at age 70 and repeat at age 6    ROS- all systems are reviewed and negatives except as per HPI above  Current Outpatient Prescriptions  Medication Sig Dispense Refill  . Amoxicillin-Pot Clavulanate (AUGMENTIN PO) Take 1,000 mg by mouth 2 (two) times daily.    Marland Kitchen BIOTIN PO Take 1 tablet by mouth daily.    Marland Kitchen BYSTOLIC 5 MG tablet TAKE ONE-HALF TABLET BY MOUTH DAILY 45 tablet 0  . Cholecalciferol  (VITAMIN D3) 2000 UNITS TABS Take 2,000 Units by mouth daily.     Marland Kitchen EPIPEN 2-PAK 0.3 MG/0.3ML SOAJ injection Inject 0.3 mg into the muscle daily as needed. Allergic reaction    . fluconazole (DIFLUCAN) 200 MG tablet Take 1 tablet by mouth once a week.    Marland Kitchen glucosamine-chondroitin 500-400 MG tablet Take 3 tablets by mouth daily.     Marland Kitchen levothyroxine (SYNTHROID, LEVOTHROID) 125 MCG tablet Take 125 mcg by mouth daily.    . Multiple Vitamin (MULTIVITAMIN) tablet Take 1 tablet by mouth daily.    Marland Kitchen omega-3 acid ethyl esters (LOVAZA) 1 G capsule Take 2 g by mouth 2 (two) times daily.    Marland Kitchen PRESCRIPTION MEDICATION as directed. STEROID - UNSURE OF NAME/DOSE    . rivaroxaban (XARELTO) 20 MG TABS tablet TAKE 1 TABLET BY MOUTH DAILY WITH SUPPER 30 tablet 6  . vitamin C (ASCORBIC ACID) 500 MG tablet Take 500 mg by mouth daily.     No current facility-administered medications for this visit.     Physical Exam: Vitals:   05/05/17 1401  BP: 98/62  Pulse: 74  SpO2: 97%  Weight: 181 lb (82.1 kg)  Height: 5\' 10"  (1.778 m)    GEN- The patient is well appearing, alert and oriented x 3 today.   Head- normocephalic, atraumatic Eyes-  Sclera clear, conjunctiva pink Ears- hearing intact Oropharynx- clear Lungs- very coarse  BS along the R lung mid and lower fields, cough with deep inspiration, normal work of breathing Heart- irregular rate and rhythm, no murmurs, rubs or gallops, PMI not laterally displaced GI- soft, NT, ND, + BS Extremities- no clubbing, cyanosis, or edema  EKG tracing ordered today is personally reviewed and shows afib, V rate 74 bpm  Echo 05/18/16 reviewed today  Assessment and Plan:  1. Longstanding persistent afib Doing well, without symptoms On xarelto (chads2vasc score is 2)  2. Pneumonia Given exposure with his old [place] home repairs, I am concerned about atypical pneumonia. I will discuss with Dr Osborne Casco.  May benefit from pulmonary referral if not improved soon.  No  changes  Return in a year for cardiology follow-up  Thompson Grayer MD, [facility] 05/05/2017 2:17 PM

## 2017-05-05 NOTE — Patient Instructions (Signed)
Medication Instructions:  Your physician recommends that you continue on your current medications as directed. Please refer to the Current Medication list given to you today.  -- If you need a refill on your cardiac medications before your next appointment, please call your pharmacy. --  Labwork: None ordered  Testing/Procedures: None ordered  Follow-Up: Your physician wants you to follow-up in: 1 year with Dr. Rayann Heman.  You will receive a reminder letter in the mail two months in advance. If you don't receive a letter, please call our office to schedule the follow-up appointment.  Thank you for choosing [facility]!!   Frederik Schmidt, RN [phone]  Any Other Special Instructions Will Be Listed Below (If Applicable).

## 2017-05-08 ENCOUNTER — Encounter: Payer: Self-pay | Admitting: Internal Medicine

## 2017-05-09 ENCOUNTER — Encounter: Payer: Self-pay | Admitting: Internal Medicine

## 2017-05-10 DIAGNOSIS — H35373 Puckering of macula, bilateral: Secondary | ICD-10-CM | POA: Diagnosis not present

## 2017-05-10 DIAGNOSIS — H35363 Drusen (degenerative) of macula, bilateral: Secondary | ICD-10-CM | POA: Diagnosis not present

## 2017-05-10 DIAGNOSIS — Z961 Presence of intraocular lens: Secondary | ICD-10-CM | POA: Diagnosis not present

## 2017-05-23 DIAGNOSIS — Z23 Encounter for immunization: Secondary | ICD-10-CM | POA: Diagnosis not present

## 2017-06-20 DIAGNOSIS — K1121 Acute sialoadenitis: Secondary | ICD-10-CM | POA: Diagnosis not present

## 2017-07-05 DIAGNOSIS — K1121 Acute sialoadenitis: Secondary | ICD-10-CM | POA: Diagnosis not present

## 2017-07-05 DIAGNOSIS — I8311 Varicose veins of right lower extremity with inflammation: Secondary | ICD-10-CM | POA: Diagnosis not present

## 2017-07-05 DIAGNOSIS — I872 Venous insufficiency (chronic) (peripheral): Secondary | ICD-10-CM | POA: Diagnosis not present

## 2017-07-05 DIAGNOSIS — L814 Other melanin hyperpigmentation: Secondary | ICD-10-CM | POA: Diagnosis not present

## 2017-07-05 DIAGNOSIS — B353 Tinea pedis: Secondary | ICD-10-CM | POA: Diagnosis not present

## 2017-07-05 DIAGNOSIS — L570 Actinic keratosis: Secondary | ICD-10-CM | POA: Diagnosis not present

## 2017-07-05 DIAGNOSIS — I8312 Varicose veins of left lower extremity with inflammation: Secondary | ICD-10-CM | POA: Diagnosis not present

## 2017-07-05 DIAGNOSIS — D1801 Hemangioma of skin and subcutaneous tissue: Secondary | ICD-10-CM | POA: Diagnosis not present

## 2017-07-28 ENCOUNTER — Other Ambulatory Visit: Payer: Self-pay | Admitting: Internal Medicine

## 2017-07-31 ENCOUNTER — Other Ambulatory Visit: Payer: Self-pay | Admitting: Internal Medicine

## 2017-07-31 NOTE — Telephone Encounter (Signed)
Medication Detail    Disp Refills Start End   BYSTOLIC 5 MG tablet 45 tablet 2 07/28/2017    Sig: TAKE 1/2 TABLET BY MOUTH DAILY   Sent to pharmacy as: BYSTOLIC 5 MG tablet   E-Prescribing Status: Receipt confirmed by pharmacy (07/28/2017 11:01 AM EST)   Pharmacy   [id number] - [place], [place]

## 2017-08-01 DIAGNOSIS — D1801 Hemangioma of skin and subcutaneous tissue: Secondary | ICD-10-CM | POA: Diagnosis not present

## 2017-08-01 DIAGNOSIS — L821 Other seborrheic keratosis: Secondary | ICD-10-CM | POA: Diagnosis not present

## 2017-08-01 DIAGNOSIS — L309 Dermatitis, unspecified: Secondary | ICD-10-CM | POA: Diagnosis not present

## 2017-08-01 DIAGNOSIS — D225 Melanocytic nevi of trunk: Secondary | ICD-10-CM | POA: Diagnosis not present

## 2017-09-05 DIAGNOSIS — I872 Venous insufficiency (chronic) (peripheral): Secondary | ICD-10-CM | POA: Diagnosis not present

## 2017-09-05 DIAGNOSIS — Z7901 Long term (current) use of anticoagulants: Secondary | ICD-10-CM | POA: Diagnosis not present

## 2017-09-05 DIAGNOSIS — H33009 Unspecified retinal detachment with retinal break, unspecified eye: Secondary | ICD-10-CM | POA: Diagnosis not present

## 2017-09-05 DIAGNOSIS — N400 Enlarged prostate without lower urinary tract symptoms: Secondary | ICD-10-CM | POA: Diagnosis not present

## 2017-09-05 DIAGNOSIS — E038 Other specified hypothyroidism: Secondary | ICD-10-CM | POA: Diagnosis not present

## 2017-09-05 DIAGNOSIS — Z1389 Encounter for screening for other disorder: Secondary | ICD-10-CM | POA: Diagnosis not present

## 2017-09-05 DIAGNOSIS — R972 Elevated prostate specific antigen [PSA]: Secondary | ICD-10-CM | POA: Diagnosis not present

## 2017-09-05 DIAGNOSIS — H9313 Tinnitus, bilateral: Secondary | ICD-10-CM | POA: Diagnosis not present

## 2017-09-05 DIAGNOSIS — D692 Other nonthrombocytopenic purpura: Secondary | ICD-10-CM | POA: Diagnosis not present

## 2017-09-05 DIAGNOSIS — Z6826 Body mass index (BMI) 26.0-26.9, adult: Secondary | ICD-10-CM | POA: Diagnosis not present

## 2017-09-05 DIAGNOSIS — R7301 Impaired fasting glucose: Secondary | ICD-10-CM | POA: Diagnosis not present

## 2017-09-05 DIAGNOSIS — I480 Paroxysmal atrial fibrillation: Secondary | ICD-10-CM | POA: Diagnosis not present

## 2017-11-10 DIAGNOSIS — H35363 Drusen (degenerative) of macula, bilateral: Secondary | ICD-10-CM | POA: Diagnosis not present

## 2017-11-10 DIAGNOSIS — H35373 Puckering of macula, bilateral: Secondary | ICD-10-CM | POA: Diagnosis not present

## 2017-11-10 DIAGNOSIS — Z961 Presence of intraocular lens: Secondary | ICD-10-CM | POA: Diagnosis not present

## 2017-11-14 ENCOUNTER — Encounter: Payer: Self-pay | Admitting: Gastroenterology

## 2017-11-20 ENCOUNTER — Other Ambulatory Visit: Payer: Self-pay | Admitting: Internal Medicine

## 2017-11-20 NOTE — Telephone Encounter (Signed)
Age 77 years Wt 82.1 kg 05/05/2017 Saw Dr Rayann Heman 05/05/2017 02/22/2017 SrCr 1.0  Hgb 15.6  HCT 46.3 CrCl 70.69  Refill done for Xarelto 20 mg daily as requested

## 2018-01-03 DIAGNOSIS — D225 Melanocytic nevi of trunk: Secondary | ICD-10-CM | POA: Diagnosis not present

## 2018-01-03 DIAGNOSIS — L570 Actinic keratosis: Secondary | ICD-10-CM | POA: Diagnosis not present

## 2018-01-03 DIAGNOSIS — L430 Hypertrophic lichen planus: Secondary | ICD-10-CM | POA: Diagnosis not present

## 2018-01-03 DIAGNOSIS — I8391 Asymptomatic varicose veins of right lower extremity: Secondary | ICD-10-CM | POA: Diagnosis not present

## 2018-01-03 DIAGNOSIS — L821 Other seborrheic keratosis: Secondary | ICD-10-CM | POA: Diagnosis not present

## 2018-01-03 DIAGNOSIS — I8392 Asymptomatic varicose veins of left lower extremity: Secondary | ICD-10-CM | POA: Diagnosis not present

## 2018-01-03 DIAGNOSIS — D485 Neoplasm of uncertain behavior of skin: Secondary | ICD-10-CM | POA: Diagnosis not present

## 2018-02-27 DIAGNOSIS — R7301 Impaired fasting glucose: Secondary | ICD-10-CM | POA: Diagnosis not present

## 2018-02-27 DIAGNOSIS — Z79899 Other long term (current) drug therapy: Secondary | ICD-10-CM | POA: Diagnosis not present

## 2018-02-27 DIAGNOSIS — E038 Other specified hypothyroidism: Secondary | ICD-10-CM | POA: Diagnosis not present

## 2018-02-27 DIAGNOSIS — R82998 Other abnormal findings in urine: Secondary | ICD-10-CM | POA: Diagnosis not present

## 2018-02-27 DIAGNOSIS — Z125 Encounter for screening for malignant neoplasm of prostate: Secondary | ICD-10-CM | POA: Diagnosis not present

## 2018-03-06 DIAGNOSIS — D692 Other nonthrombocytopenic purpura: Secondary | ICD-10-CM | POA: Diagnosis not present

## 2018-03-06 DIAGNOSIS — Z1389 Encounter for screening for other disorder: Secondary | ICD-10-CM | POA: Diagnosis not present

## 2018-03-06 DIAGNOSIS — H33009 Unspecified retinal detachment with retinal break, unspecified eye: Secondary | ICD-10-CM | POA: Diagnosis not present

## 2018-03-06 DIAGNOSIS — Z7901 Long term (current) use of anticoagulants: Secondary | ICD-10-CM | POA: Diagnosis not present

## 2018-03-06 DIAGNOSIS — I480 Paroxysmal atrial fibrillation: Secondary | ICD-10-CM | POA: Diagnosis not present

## 2018-03-06 DIAGNOSIS — N400 Enlarged prostate without lower urinary tract symptoms: Secondary | ICD-10-CM | POA: Diagnosis not present

## 2018-03-06 DIAGNOSIS — R7301 Impaired fasting glucose: Secondary | ICD-10-CM | POA: Diagnosis not present

## 2018-03-06 DIAGNOSIS — E038 Other specified hypothyroidism: Secondary | ICD-10-CM | POA: Diagnosis not present

## 2018-03-06 DIAGNOSIS — Z6826 Body mass index (BMI) 26.0-26.9, adult: Secondary | ICD-10-CM | POA: Diagnosis not present

## 2018-03-06 DIAGNOSIS — H9313 Tinnitus, bilateral: Secondary | ICD-10-CM | POA: Diagnosis not present

## 2018-03-06 DIAGNOSIS — I8390 Asymptomatic varicose veins of unspecified lower extremity: Secondary | ICD-10-CM | POA: Diagnosis not present

## 2018-03-06 DIAGNOSIS — Z0000 Encounter for general adult medical examination without abnormal findings: Secondary | ICD-10-CM | POA: Diagnosis not present

## 2018-03-08 DIAGNOSIS — Z0000 Encounter for general adult medical examination without abnormal findings: Secondary | ICD-10-CM | POA: Diagnosis not present

## 2018-03-09 DIAGNOSIS — Z1212 Encounter for screening for malignant neoplasm of rectum: Secondary | ICD-10-CM | POA: Diagnosis not present

## 2018-03-31 DIAGNOSIS — Z23 Encounter for immunization: Secondary | ICD-10-CM | POA: Diagnosis not present

## 2018-04-30 ENCOUNTER — Encounter: Payer: Self-pay | Admitting: Internal Medicine

## 2018-05-14 ENCOUNTER — Encounter: Payer: Self-pay | Admitting: Internal Medicine

## 2018-05-14 ENCOUNTER — Ambulatory Visit (INDEPENDENT_AMBULATORY_CARE_PROVIDER_SITE_OTHER): Payer: Medicare Other | Admitting: Internal Medicine

## 2018-05-14 VITALS — BP 114/66 | HR 58 | Ht 70.0 in | Wt 179.6 lb

## 2018-05-14 DIAGNOSIS — I4819 Other persistent atrial fibrillation: Secondary | ICD-10-CM | POA: Diagnosis not present

## 2018-05-14 NOTE — Progress Notes (Signed)
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  Since last being seen in our clinic, the patient reports doing very well.  Today, he denies symptoms of palpitations, chest pain, shortness of breath,  lower extremity edema, dizziness, presyncope, or syncope.  The patient is otherwise without complaint today.  He is excited about moving to [place] in the next few weeks.    Past Medical History:  Diagnosis Date  . BPH (benign prostatic hyperplasia)   . Hyperlipidemia   . Hypertension   . Hypothyroidism   . Multinodular goiter   . Paroxysmal atrial fibrillation (HCC)   . PONV (postoperative nausea and vomiting)    eggs  . Tinnitus   . Wears glasses    Past Surgical History:  Procedure Laterality Date  . [place]   left  . CATARACT EXTRACTION  2004   both  . CHONDROPLASTY Left 05/13/2014   Procedure: CHONDROPLASTY;  Surgeon: Hessie Dibble, MD;  Location: [place];  Service: Orthopedics;  Laterality: Left;  . COLONOSCOPY    . KNEE ARTHROSCOPY WITH MEDIAL MENISECTOMY Left 05/13/2014   Procedure: KNEE ARTHROSCOPY WITH MEDIAL MENISECTOMY;  Surgeon: Hessie Dibble, MD;  Location: [facility] [place];  Service: Orthopedics;  Laterality: Left;  . TONSILLECTOMY AND ADENOIDECTOMY  1943   at age 68 and repeat at age 26    ROS- all systems are reviewed and negatives except as per HPI above  Current Outpatient Medications  Medication Sig Dispense Refill  . Amoxicillin-Pot Clavulanate (AUGMENTIN PO) Take 1,000 mg by mouth 2 (two) times daily.    Marland Kitchen BIOTIN PO Take 1 tablet by mouth daily.    Marland Kitchen BYSTOLIC 5 MG tablet TAKE 1/2 TABLET BY MOUTH DAILY 45 tablet 2  . Cholecalciferol (VITAMIN D3) 2000 UNITS TABS Take 2,000 Units by mouth daily.     Marland Kitchen EPIPEN 2-PAK 0.3 MG/0.3ML SOAJ injection Inject 0.3 mg into the muscle daily as needed. Allergic  reaction    . fluconazole (DIFLUCAN) 200 MG tablet Take 1 tablet by mouth once a week.    Marland Kitchen glucosamine-chondroitin 500-400 MG tablet Take 3 tablets by mouth daily.     Marland Kitchen levothyroxine (SYNTHROID, LEVOTHROID) 125 MCG tablet Take 125 mcg by mouth daily.    . Multiple Vitamin (MULTIVITAMIN) tablet Take 1 tablet by mouth daily.    Marland Kitchen omega-3 acid ethyl esters (LOVAZA) 1 G capsule Take 2 g by mouth 2 (two) times daily.    Marland Kitchen PRESCRIPTION MEDICATION as directed. STEROID - UNSURE OF NAME/DOSE    . vitamin C (ASCORBIC ACID) 500 MG tablet Take 500 mg by mouth daily.    Alveda Reasons 20 MG TABS tablet TAKE 1 TABLET BY MOUTH DAILY WITH SUPPER 30 tablet 5   No current facility-administered medications for this visit.     Physical Exam: Vitals:   05/14/18 1251  BP: 114/66  Pulse: (!) 58  SpO2: 98%  Weight: 179 lb 9.6 oz (81.5 kg)  Height: 5\' 10"  (1.778 m)    GEN- The patient is well appearing, alert and oriented x 3 today.   Head- normocephalic, atraumatic Eyes-  Sclera clear, conjunctiva pink Ears- hearing intact Oropharynx- clear Lungs- Clear to ausculation bilaterally, normal work of breathing Heart- irregular rate and rhythm, no murmurs, rubs or gallops, PMI not laterally displaced GI- soft, NT, ND, + BS Extremities-  no clubbing, cyanosis, or edema  Wt Readings from Last 3 Encounters:  05/14/18 179 lb 9.6 oz (81.5 kg)  05/05/17 181 lb (82.1 kg)  05/05/16 182 lb (82.6 kg)    EKG tracing ordered today is personally reviewed and shows afib, V rate 58 bpm  Assessment and Plan:  1. Permanent afib chads2vasc score is 2. Continue xarelto  Return to see me in a year  Thompson Grayer MD, [facility] 05/14/2018 1:09 PM

## 2018-05-14 NOTE — Patient Instructions (Signed)

## 2018-06-13 DIAGNOSIS — M76892 Other specified enthesopathies of left lower limb, excluding foot: Secondary | ICD-10-CM | POA: Diagnosis not present

## 2018-06-13 DIAGNOSIS — M25552 Pain in left hip: Secondary | ICD-10-CM | POA: Diagnosis not present

## 2018-06-19 DIAGNOSIS — S76312D Strain of muscle, fascia and tendon of the posterior muscle group at thigh level, left thigh, subsequent encounter: Secondary | ICD-10-CM | POA: Diagnosis not present

## 2018-06-28 DIAGNOSIS — S76312D Strain of muscle, fascia and tendon of the posterior muscle group at thigh level, left thigh, subsequent encounter: Secondary | ICD-10-CM | POA: Diagnosis not present

## 2018-07-03 DIAGNOSIS — S76312D Strain of muscle, fascia and tendon of the posterior muscle group at thigh level, left thigh, subsequent encounter: Secondary | ICD-10-CM | POA: Diagnosis not present

## 2018-07-05 DIAGNOSIS — L821 Other seborrheic keratosis: Secondary | ICD-10-CM | POA: Diagnosis not present

## 2018-07-05 DIAGNOSIS — L814 Other melanin hyperpigmentation: Secondary | ICD-10-CM | POA: Diagnosis not present

## 2018-07-05 DIAGNOSIS — S76312D Strain of muscle, fascia and tendon of the posterior muscle group at thigh level, left thigh, subsequent encounter: Secondary | ICD-10-CM | POA: Diagnosis not present

## 2018-07-05 DIAGNOSIS — D225 Melanocytic nevi of trunk: Secondary | ICD-10-CM | POA: Diagnosis not present

## 2018-07-05 DIAGNOSIS — L570 Actinic keratosis: Secondary | ICD-10-CM | POA: Diagnosis not present

## 2018-07-05 DIAGNOSIS — D1801 Hemangioma of skin and subcutaneous tissue: Secondary | ICD-10-CM | POA: Diagnosis not present

## 2018-07-10 DIAGNOSIS — S76312D Strain of muscle, fascia and tendon of the posterior muscle group at thigh level, left thigh, subsequent encounter: Secondary | ICD-10-CM | POA: Diagnosis not present

## 2018-07-11 DIAGNOSIS — M76892 Other specified enthesopathies of left lower limb, excluding foot: Secondary | ICD-10-CM | POA: Diagnosis not present

## 2018-07-12 DIAGNOSIS — S76312D Strain of muscle, fascia and tendon of the posterior muscle group at thigh level, left thigh, subsequent encounter: Secondary | ICD-10-CM | POA: Diagnosis not present

## 2018-07-16 DIAGNOSIS — S76312D Strain of muscle, fascia and tendon of the posterior muscle group at thigh level, left thigh, subsequent encounter: Secondary | ICD-10-CM | POA: Diagnosis not present

## 2018-07-24 DIAGNOSIS — S76312D Strain of muscle, fascia and tendon of the posterior muscle group at thigh level, left thigh, subsequent encounter: Secondary | ICD-10-CM | POA: Diagnosis not present

## 2018-07-26 DIAGNOSIS — S76312D Strain of muscle, fascia and tendon of the posterior muscle group at thigh level, left thigh, subsequent encounter: Secondary | ICD-10-CM | POA: Diagnosis not present

## 2018-07-28 DIAGNOSIS — M545 Low back pain: Secondary | ICD-10-CM | POA: Diagnosis not present

## 2018-07-31 DIAGNOSIS — S76312D Strain of muscle, fascia and tendon of the posterior muscle group at thigh level, left thigh, subsequent encounter: Secondary | ICD-10-CM | POA: Diagnosis not present

## 2018-08-02 DIAGNOSIS — M545 Low back pain: Secondary | ICD-10-CM | POA: Diagnosis not present

## 2018-08-02 DIAGNOSIS — S76312D Strain of muscle, fascia and tendon of the posterior muscle group at thigh level, left thigh, subsequent encounter: Secondary | ICD-10-CM | POA: Diagnosis not present

## 2018-08-06 DIAGNOSIS — M545 Low back pain: Secondary | ICD-10-CM | POA: Diagnosis not present

## 2018-08-07 DIAGNOSIS — M48061 Spinal stenosis, lumbar region without neurogenic claudication: Secondary | ICD-10-CM | POA: Diagnosis not present

## 2018-08-10 ENCOUNTER — Telehealth: Payer: Self-pay

## 2018-08-10 NOTE — Telephone Encounter (Signed)
   [facility] Pre-operative Risk Assessment    Request for surgical clearance:  1. What type of surgery is being performed? Epidural Steroid Injection   2. When is this surgery scheduled? TBD  3. What type of clearance is required (medical clearance vs. Pharmacy clearance to hold med vs. Both)? Pharmacy  4. Are there any medications that need to be held prior to surgery and how long?  Xarelto 2 days prior  5. Practice name and name of physician performing surgery? [facility]  6. What is your office phone number [phone]   7.   What is your office fax number       [phone]  8.   Anesthesia type (None, local, MAC, general) ?

## 2018-08-15 NOTE — Telephone Encounter (Signed)
  Patient calling again to have someone call him regarding stopping his Zarelto

## 2018-08-15 NOTE — Telephone Encounter (Signed)
-----   Message from Anson Crofts sent at 08/14/2018  4:19 PM EST ----- Patient left a message for someone to call him about the note to stop his Zarelto.  Thanks.

## 2018-08-16 NOTE — Telephone Encounter (Signed)
   Primary Cardiologist: Thompson Grayer, MD  Chart reviewed as part of pre-operative protocol coverage.   Per pharmacy recommendations, patient can hold xarelto for 3 days prior to upcoming spinal procedure. He should restart xarelto once cleared to restart by his surgeon.   I will route this recommendation to the requesting party via Epic fax function and remove from pre-op pool.  Please call with questions.  Abigail Butts, PA-C 08/16/2018, 4:46 PM

## 2018-08-16 NOTE — Telephone Encounter (Signed)
Pt takes Xarelto for afib with CHADS2VASc score of 3 (age x2, HTN). Renal function is normal. Recommend holding Xarelto for 3 days prior to spinal procedure per protocol.

## 2018-09-04 DIAGNOSIS — M48061 Spinal stenosis, lumbar region without neurogenic claudication: Secondary | ICD-10-CM | POA: Diagnosis not present

## 2018-09-06 DIAGNOSIS — E038 Other specified hypothyroidism: Secondary | ICD-10-CM | POA: Diagnosis not present

## 2018-09-06 DIAGNOSIS — Z7901 Long term (current) use of anticoagulants: Secondary | ICD-10-CM | POA: Diagnosis not present

## 2018-09-06 DIAGNOSIS — N400 Enlarged prostate without lower urinary tract symptoms: Secondary | ICD-10-CM | POA: Diagnosis not present

## 2018-09-06 DIAGNOSIS — I8390 Asymptomatic varicose veins of unspecified lower extremity: Secondary | ICD-10-CM | POA: Diagnosis not present

## 2018-09-06 DIAGNOSIS — H33009 Unspecified retinal detachment with retinal break, unspecified eye: Secondary | ICD-10-CM | POA: Diagnosis not present

## 2018-09-06 DIAGNOSIS — I4821 Permanent atrial fibrillation: Secondary | ICD-10-CM | POA: Diagnosis not present

## 2018-09-06 DIAGNOSIS — Z6825 Body mass index (BMI) 25.0-25.9, adult: Secondary | ICD-10-CM | POA: Diagnosis not present

## 2018-09-06 DIAGNOSIS — R972 Elevated prostate specific antigen [PSA]: Secondary | ICD-10-CM | POA: Diagnosis not present

## 2018-09-06 DIAGNOSIS — R7301 Impaired fasting glucose: Secondary | ICD-10-CM | POA: Diagnosis not present

## 2018-09-06 DIAGNOSIS — D692 Other nonthrombocytopenic purpura: Secondary | ICD-10-CM | POA: Diagnosis not present

## 2018-09-25 DIAGNOSIS — M48061 Spinal stenosis, lumbar region without neurogenic claudication: Secondary | ICD-10-CM | POA: Diagnosis not present

## 2018-10-16 DIAGNOSIS — M4316 Spondylolisthesis, lumbar region: Secondary | ICD-10-CM | POA: Diagnosis not present

## 2018-10-16 DIAGNOSIS — M5416 Radiculopathy, lumbar region: Secondary | ICD-10-CM | POA: Diagnosis not present

## 2018-12-18 DIAGNOSIS — Z6825 Body mass index (BMI) 25.0-25.9, adult: Secondary | ICD-10-CM | POA: Diagnosis not present

## 2018-12-18 DIAGNOSIS — M4316 Spondylolisthesis, lumbar region: Secondary | ICD-10-CM | POA: Diagnosis not present

## 2019-01-03 DIAGNOSIS — M25561 Pain in right knee: Secondary | ICD-10-CM | POA: Diagnosis not present

## 2019-01-03 DIAGNOSIS — S83241A Other tear of medial meniscus, current injury, right knee, initial encounter: Secondary | ICD-10-CM | POA: Diagnosis not present

## 2019-01-18 DIAGNOSIS — D485 Neoplasm of uncertain behavior of skin: Secondary | ICD-10-CM | POA: Diagnosis not present

## 2019-01-18 DIAGNOSIS — D1801 Hemangioma of skin and subcutaneous tissue: Secondary | ICD-10-CM | POA: Diagnosis not present

## 2019-01-18 DIAGNOSIS — L821 Other seborrheic keratosis: Secondary | ICD-10-CM | POA: Diagnosis not present

## 2019-01-18 DIAGNOSIS — D225 Melanocytic nevi of trunk: Secondary | ICD-10-CM | POA: Diagnosis not present

## 2019-01-18 DIAGNOSIS — L570 Actinic keratosis: Secondary | ICD-10-CM | POA: Diagnosis not present

## 2019-01-24 DIAGNOSIS — M25561 Pain in right knee: Secondary | ICD-10-CM | POA: Diagnosis not present

## 2019-01-24 DIAGNOSIS — M1711 Unilateral primary osteoarthritis, right knee: Secondary | ICD-10-CM | POA: Diagnosis not present

## 2019-02-04 DIAGNOSIS — S61421A Laceration with foreign body of right hand, initial encounter: Secondary | ICD-10-CM | POA: Diagnosis not present

## 2019-02-04 DIAGNOSIS — Z23 Encounter for immunization: Secondary | ICD-10-CM | POA: Diagnosis not present

## 2019-02-07 DIAGNOSIS — Z6825 Body mass index (BMI) 25.0-25.9, adult: Secondary | ICD-10-CM | POA: Diagnosis not present

## 2019-02-07 DIAGNOSIS — I10 Essential (primary) hypertension: Secondary | ICD-10-CM | POA: Diagnosis not present

## 2019-02-07 DIAGNOSIS — M4316 Spondylolisthesis, lumbar region: Secondary | ICD-10-CM | POA: Diagnosis not present

## 2019-03-04 DIAGNOSIS — E038 Other specified hypothyroidism: Secondary | ICD-10-CM | POA: Diagnosis not present

## 2019-03-04 DIAGNOSIS — R7301 Impaired fasting glucose: Secondary | ICD-10-CM | POA: Diagnosis not present

## 2019-03-04 DIAGNOSIS — R82998 Other abnormal findings in urine: Secondary | ICD-10-CM | POA: Diagnosis not present

## 2019-03-11 DIAGNOSIS — R7301 Impaired fasting glucose: Secondary | ICD-10-CM | POA: Diagnosis not present

## 2019-03-11 DIAGNOSIS — I8390 Asymptomatic varicose veins of unspecified lower extremity: Secondary | ICD-10-CM | POA: Diagnosis not present

## 2019-03-11 DIAGNOSIS — H9319 Tinnitus, unspecified ear: Secondary | ICD-10-CM | POA: Diagnosis not present

## 2019-03-11 DIAGNOSIS — Z23 Encounter for immunization: Secondary | ICD-10-CM | POA: Diagnosis not present

## 2019-03-11 DIAGNOSIS — H33009 Unspecified retinal detachment with retinal break, unspecified eye: Secondary | ICD-10-CM | POA: Diagnosis not present

## 2019-03-11 DIAGNOSIS — N400 Enlarged prostate without lower urinary tract symptoms: Secondary | ICD-10-CM | POA: Diagnosis not present

## 2019-03-11 DIAGNOSIS — I4821 Permanent atrial fibrillation: Secondary | ICD-10-CM | POA: Diagnosis not present

## 2019-03-11 DIAGNOSIS — E039 Hypothyroidism, unspecified: Secondary | ICD-10-CM | POA: Diagnosis not present

## 2019-03-11 DIAGNOSIS — Z0000 Encounter for general adult medical examination without abnormal findings: Secondary | ICD-10-CM | POA: Diagnosis not present

## 2019-03-11 DIAGNOSIS — L439 Lichen planus, unspecified: Secondary | ICD-10-CM | POA: Diagnosis not present

## 2019-03-11 DIAGNOSIS — M179 Osteoarthritis of knee, unspecified: Secondary | ICD-10-CM | POA: Diagnosis not present

## 2019-03-11 DIAGNOSIS — Z7901 Long term (current) use of anticoagulants: Secondary | ICD-10-CM | POA: Diagnosis not present

## 2019-03-11 DIAGNOSIS — R972 Elevated prostate specific antigen [PSA]: Secondary | ICD-10-CM | POA: Diagnosis not present

## 2019-04-23 ENCOUNTER — Ambulatory Visit (INDEPENDENT_AMBULATORY_CARE_PROVIDER_SITE_OTHER): Payer: Medicare Other | Admitting: Gastroenterology

## 2019-04-23 ENCOUNTER — Encounter: Payer: Self-pay | Admitting: Gastroenterology

## 2019-04-23 VITALS — BP 110/80 | HR 64 | Temp 98.6°F | Ht 70.0 in | Wt 177.0 lb

## 2019-04-23 DIAGNOSIS — I4891 Unspecified atrial fibrillation: Secondary | ICD-10-CM

## 2019-04-23 DIAGNOSIS — Z800 Family history of malignant neoplasm of digestive organs: Secondary | ICD-10-CM | POA: Diagnosis not present

## 2019-04-23 DIAGNOSIS — Z7901 Long term (current) use of anticoagulants: Secondary | ICD-10-CM

## 2019-04-23 NOTE — Patient Instructions (Signed)
We will discontinue colonoscopies in the future unless you should develop symptoms warranting one.  Please follow up with Dr Loletha Carrow as needed.  If you are age 80 or older, your body mass index should be between 23-30. Your Body mass index is 25.4 kg/m. If this is out of the aforementioned range listed, please consider follow up with your Primary Care Provider.  If you are age 34 or younger, your body mass index should be between 19-25. Your Body mass index is 25.4 kg/m. If this is out of the aformentioned range listed, please consider follow up with your Primary Care Provider.   It was a pleasure to see you today!  Dr. Loletha Carrow

## 2019-04-23 NOTE — Progress Notes (Signed)
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  His father reportedly developed colon cancer in his mid to late 10s, but Dr. Erlene Quan says that his father had not had regular medical care for years, and he is certain he had never had colon cancer screening.  Review of procedures with Dr. Boykin Reaper indicate the following: 2004 hyperplastic polyps, No polyps 2008, only hyperplastic polyps 2014  Dr. Erlene Quan feels well, denies abdominal pain, altered bowel habits or rectal bleeding.  He has been busy lately making some repairs on his house to put it on the market, and his wife is also apparently developing worsening memory difficulty which is an increased source of stress.   ROS:  Review of Systems He denies exertional chest pain, dyspnea or dysuria  Past Medical History: Past Medical History:  Diagnosis Date  . BPH (benign prostatic hyperplasia)   . Hyperlipidemia   . Hypertension   . Hypothyroidism   . Multinodular goiter   . Paroxysmal atrial fibrillation (HCC)   . PONV (postoperative nausea and vomiting)    eggs  . Tinnitus   . Wears glasses      Past Surgical History: Past Surgical History:  Procedure Laterality Date  . [place]   left  . CATARACT EXTRACTION  2004   both  . CHONDROPLASTY Left 05/13/2014   Procedure: CHONDROPLASTY;  Surgeon: Hessie Dibble, MD;  Location: [place];  Service: Orthopedics;  Laterality: Left;  . COLONOSCOPY    . KNEE ARTHROSCOPY WITH MEDIAL MENISECTOMY Left 05/13/2014   Procedure: KNEE ARTHROSCOPY WITH MEDIAL MENISECTOMY;  Surgeon: Hessie Dibble, MD;  Location: [place];  Service: Orthopedics;  Laterality: Left;  . TONSILLECTOMY AND ADENOIDECTOMY  1943   at age 69 and repeat at age 18     Family History: Family History  Problem Relation Age of Onset  . Colon cancer Father 10    Social History: Social History   Socioeconomic History  . Marital status: Married    Spouse name: Not on file  . Number of children: Not on file  . Years of education: Not on file  . Highest education level: Not on file  Occupational History  . Not on file  Social Needs  . Financial resource strain: Not on file  . Food insecurity    Worry: Not on file    Inability: Not on file  . Transportation needs    Medical: Not on file    Non-medical: Not on file  Tobacco Use  . Smoking status: Former Smoker    Quit date: 01/15/1965    Years since quitting: 54.3  . Smokeless tobacco: Never Used  Substance and Sexual Activity  . Alcohol use: Yes    Alcohol/week: 4.0 standard drinks    Types: 4 Glasses of wine per week    Comment: occasional red wine 3 to 4 x per week  . Drug use: No  . Sexual activity: Not Currently  Lifestyle  . Physical activity    Days per week: Not on file    Minutes per session: Not on file  . Stress: Not on file  Relationships  .  Social Herbalist on phone: Not on file    Gets together: Not on file    Attends religious service: Not on file    Active member of club or organization: Not on file    Attends meetings of clubs or organizations: Not on file    Relationship status: Not on file  Other Topics Concern  . 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 Albumen, Egg Swelling  . Bee Venom Anaphylaxis  . Eggs Or Egg-Derived Products Nausea And Vomiting  . Latex Rash     Outpatient Meds: Current Outpatient Medications  Medication Sig Dispense Refill  . BIOTIN PO Take 1 tablet by mouth daily.    Marland Kitchen BYSTOLIC 5 MG tablet TAKE 1/2 TABLET BY MOUTH DAILY 45 tablet 2  . Cholecalciferol (VITAMIN D3) 2000 UNITS TABS Take 2,000 Units by mouth daily.     Marland Kitchen EPIPEN 2-PAK 0.3 MG/0.3ML SOAJ injection Inject 0.3 mg into the muscle daily as needed. Allergic reaction    . glucosamine-chondroitin 500-400 MG tablet Take 3 tablets by mouth daily.     Marland Kitchen levothyroxine (SYNTHROID, LEVOTHROID) 125 MCG tablet Take 125 mcg by mouth daily.    . Multiple Vitamin (MULTIVITAMIN) tablet Take 1 tablet by mouth daily.    Marland Kitchen omega-3 acid ethyl esters (LOVAZA) 1 G capsule Take 2 g by mouth 2 (two) times daily.    . vitamin C (ASCORBIC ACID) 500 MG tablet Take 500 mg by mouth daily.    Alveda Reasons 20 MG TABS tablet TAKE 1 TABLET BY MOUTH DAILY WITH SUPPER 30 tablet 5   No current facility-administered medications for this visit.       ___________________________________________________________________ Objective   Exam:  BP 110/80   Pulse 64   Temp 98.6 F (37 C) (Oral)   Ht 5\' 10"  (1.778 m)   Wt 177 lb (80.3 kg)   BMI 25.40 kg/m    General: Well-appearing, pleasant and conversational  Eyes: sclera anicteric, no redness  ENT: oral mucosa moist without lesions, no cervical or supraclavicular lymphadenopathy  CV: RRR without murmur, S1/S2, no JVD, no peripheral edema  Resp: clear to auscultation bilaterally, normal RR and effort noted  GI: soft, no tenderness, with active bowel sounds. No guarding or palpable organomegaly noted.    Assessment: Encounter Diagnoses  Name Primary?  . Family history of colon cancer Yes  . Atrial fibrillation, unspecified type ([place])   . Current use of long term anticoagulation     Given the advanced age and which his father developed colon cancer, and the fact that he had never had screening, we discussed how that was most likely a sporadic  case rather than from genetic mutation that he may have passed on to this patient.  Understanding that, the patient's current age and no personal history of adenomatous polyps at least as far back as 2004, current guidelines are that he does not need any further colon cancer screening.  That would include colonoscopy and also annual fecal occult blood testing.  He is more likely to have complications from a colonoscopy than benefit from colon cancer prevention. Should he develop in terms of concern or iron deficiency anemia, then he should be  referred back to see me for further work-up.  He was very happy to hear that, and agreement with this plan, and will see me as needed.   Thank you for the courtesy of this consult.  Please call me with any questions or concerns.  Nelida Meuse III  CC: Referring provider noted above

## 2019-05-16 ENCOUNTER — Telehealth: Payer: Self-pay

## 2019-05-20 ENCOUNTER — Telehealth: Payer: Medicare Other | Admitting: Internal Medicine

## 2019-05-22 ENCOUNTER — Telehealth (INDEPENDENT_AMBULATORY_CARE_PROVIDER_SITE_OTHER): Payer: Medicare Other | Admitting: Internal Medicine

## 2019-05-22 ENCOUNTER — Encounter: Payer: Self-pay | Admitting: Internal Medicine

## 2019-05-22 ENCOUNTER — Other Ambulatory Visit: Payer: Self-pay

## 2019-05-22 VITALS — BP 112/74 | HR 86 | Ht 70.0 in | Wt 167.0 lb

## 2019-05-22 DIAGNOSIS — I4821 Permanent atrial fibrillation: Secondary | ICD-10-CM

## 2019-05-22 NOTE — Progress Notes (Signed)
Electrophysiology TeleHealth Note   Due to national recommendations of social distancing due to [place] 19, an audio telehealth visit is felt to be most appropriate for this patient at this time.  Verbal consent was obtained by me for the telehealth visit today.  The patient does not have capability for a virtual visit.  A phone visit is therefore required today.   Date:  05/22/2019   ID:  Jason Ali, DOB 18-Oct-1940, MRN [id number]  Location: patient's home  Provider location:  [facility]  Evaluation Performed: Follow-up visit  PCP:  Tisovec, Fransico Him, MD   Electrophysiologist:  Dr Rayann Heman  Chief Complaint:  palpitations  History of Present Illness:    Jason Ali is a 78 y.o. male who presents via telehealth conferencing today.  Since last being seen in our clinic, the patient reports doing very well.  He still lives at [facility] and is doing well.  Today, he denies symptoms of palpitations, chest pain, shortness of breath, dizziness, presyncope, or syncope. He wears support hose and does not have swelling.   The patient is otherwise without complaint today.     Past Medical History:  Diagnosis Date  . BPH (benign prostatic hyperplasia)   . Hyperlipidemia   . Hypertension   . Hypothyroidism   . Multinodular goiter   . Permanent atrial fibrillation ([place])   . PONV (postoperative nausea and vomiting)    eggs  . Tinnitus   . Wears glasses     Past Surgical History:  Procedure Laterality Date  . [place]   left  . CATARACT EXTRACTION  2004   both  . CHONDROPLASTY Left 05/13/2014   Procedure: CHONDROPLASTY;  Surgeon: Hessie Dibble, MD;  Location: [place];  Service: Orthopedics;  Laterality: Left;  . COLONOSCOPY    . KNEE ARTHROSCOPY WITH MEDIAL MENISECTOMY Left 05/13/2014   Procedure: KNEE ARTHROSCOPY WITH MEDIAL MENISECTOMY;  Surgeon: Hessie Dibble, MD;  Location: [place];  Service:  Orthopedics;  Laterality: Left;  . TONSILLECTOMY AND ADENOIDECTOMY  1943   at age 31 and repeat at age 57    Current Outpatient Medications  Medication Sig Dispense Refill  . BIOTIN PO Take 1 tablet by mouth daily.    Marland Kitchen BYSTOLIC 5 MG tablet TAKE 1/2 TABLET BY MOUTH DAILY 45 tablet 2  . Cholecalciferol (VITAMIN D3) 2000 UNITS TABS Take 2,000 Units by mouth daily.     Marland Kitchen EPIPEN 2-PAK 0.3 MG/0.3ML SOAJ injection Inject 0.3 mg into the muscle daily as needed. Allergic reaction    . glucosamine-chondroitin 500-400 MG tablet Take 3 tablets by mouth daily.     Marland Kitchen levothyroxine (SYNTHROID, LEVOTHROID) 125 MCG tablet Take 125 mcg by mouth daily.    . Multiple Vitamin (MULTIVITAMIN) tablet Take 1 tablet by mouth daily.    Marland Kitchen omega-3 acid ethyl esters (LOVAZA) 1 G capsule Take 2 g by mouth 2 (two) times daily.    . vitamin C (ASCORBIC ACID) 500 MG tablet Take 500 mg by mouth daily.    Alveda Reasons 20 MG TABS tablet TAKE 1 TABLET BY MOUTH DAILY WITH SUPPER 30 tablet 5   No current facility-administered medications for this visit.     Allergies:   Albumen, egg; Bee venom; Eggs or egg-derived products; and Latex   Social History:  The patient  reports that he quit smoking about 54 years ago. He has never used smokeless tobacco. He  reports current alcohol use of about 4.0 standard drinks of alcohol per week. He reports that he does not use drugs.   Family History:  The patient's family history includes Colon cancer (age of onset: 21) in his father.   ROS:  Please see the history of present illness.   All other systems are personally reviewed and negative.    Exam:    Vital Signs:  BP 112/74   Pulse 86   Ht 5\' 10"  (1.778 m)   Wt 167 lb (75.8 kg)   BMI 23.96 kg/m   Well sounding, alert and conversant   Labs/Other Tests and Data Reviewed:    Recent Labs: No results found for requested labs within last 8760 hours.   Wt Readings from Last 3 Encounters:  05/22/19 167 lb (75.8 kg)  04/23/19 177 lb  (80.3 kg)  05/14/18 179 lb 9.6 oz (81.5 kg)       ASSESSMENT & PLAN:    1.  Permanent atrial fibrillation Rate controlled Doing very well chads2vas score is 2.  He is on xarelto   Follow-up:  Return to see me in a year   Patient Risk:  after full review of this patients clinical status, I feel that they are at moderate risk at this time.  Today, I have spent 15 minutes with the patient with telehealth technology discussing arrhythmia management .    Army Fossa, MD  05/22/2019 11:17 AM     [facility] [address] [place] [address] [phone] (office) [phone] (fax)

## 2019-08-07 DIAGNOSIS — Z23 Encounter for immunization: Secondary | ICD-10-CM | POA: Diagnosis not present

## 2019-09-03 DIAGNOSIS — Z23 Encounter for immunization: Secondary | ICD-10-CM | POA: Diagnosis not present

## 2019-09-11 DIAGNOSIS — M48061 Spinal stenosis, lumbar region without neurogenic claudication: Secondary | ICD-10-CM | POA: Diagnosis not present

## 2019-09-11 DIAGNOSIS — I8390 Asymptomatic varicose veins of unspecified lower extremity: Secondary | ICD-10-CM | POA: Diagnosis not present

## 2019-09-11 DIAGNOSIS — D692 Other nonthrombocytopenic purpura: Secondary | ICD-10-CM | POA: Diagnosis not present

## 2019-09-11 DIAGNOSIS — N400 Enlarged prostate without lower urinary tract symptoms: Secondary | ICD-10-CM | POA: Diagnosis not present

## 2019-09-11 DIAGNOSIS — H33009 Unspecified retinal detachment with retinal break, unspecified eye: Secondary | ICD-10-CM | POA: Diagnosis not present

## 2019-09-11 DIAGNOSIS — R7301 Impaired fasting glucose: Secondary | ICD-10-CM | POA: Diagnosis not present

## 2019-09-11 DIAGNOSIS — E039 Hypothyroidism, unspecified: Secondary | ICD-10-CM | POA: Diagnosis not present

## 2019-09-11 DIAGNOSIS — Z1331 Encounter for screening for depression: Secondary | ICD-10-CM | POA: Diagnosis not present

## 2019-09-11 DIAGNOSIS — Z7901 Long term (current) use of anticoagulants: Secondary | ICD-10-CM | POA: Diagnosis not present

## 2019-09-11 DIAGNOSIS — M179 Osteoarthritis of knee, unspecified: Secondary | ICD-10-CM | POA: Diagnosis not present

## 2019-09-11 DIAGNOSIS — E038 Other specified hypothyroidism: Secondary | ICD-10-CM | POA: Diagnosis not present

## 2019-09-11 DIAGNOSIS — I4821 Permanent atrial fibrillation: Secondary | ICD-10-CM | POA: Diagnosis not present

## 2019-10-17 DIAGNOSIS — H35373 Puckering of macula, bilateral: Secondary | ICD-10-CM | POA: Diagnosis not present

## 2019-10-17 DIAGNOSIS — Z961 Presence of intraocular lens: Secondary | ICD-10-CM | POA: Diagnosis not present

## 2019-10-17 DIAGNOSIS — H35363 Drusen (degenerative) of macula, bilateral: Secondary | ICD-10-CM | POA: Diagnosis not present

## 2019-10-17 DIAGNOSIS — H26492 Other secondary cataract, left eye: Secondary | ICD-10-CM | POA: Diagnosis not present

## 2020-01-20 DIAGNOSIS — D171 Benign lipomatous neoplasm of skin and subcutaneous tissue of trunk: Secondary | ICD-10-CM | POA: Diagnosis not present

## 2020-03-04 DIAGNOSIS — E038 Other specified hypothyroidism: Secondary | ICD-10-CM | POA: Diagnosis not present

## 2020-03-04 DIAGNOSIS — R7301 Impaired fasting glucose: Secondary | ICD-10-CM | POA: Diagnosis not present

## 2020-03-04 DIAGNOSIS — Z125 Encounter for screening for malignant neoplasm of prostate: Secondary | ICD-10-CM | POA: Diagnosis not present

## 2020-03-12 DIAGNOSIS — E039 Hypothyroidism, unspecified: Secondary | ICD-10-CM | POA: Diagnosis not present

## 2020-03-12 DIAGNOSIS — H9319 Tinnitus, unspecified ear: Secondary | ICD-10-CM | POA: Diagnosis not present

## 2020-03-12 DIAGNOSIS — Z7901 Long term (current) use of anticoagulants: Secondary | ICD-10-CM | POA: Diagnosis not present

## 2020-03-12 DIAGNOSIS — N400 Enlarged prostate without lower urinary tract symptoms: Secondary | ICD-10-CM | POA: Diagnosis not present

## 2020-03-12 DIAGNOSIS — M179 Osteoarthritis of knee, unspecified: Secondary | ICD-10-CM | POA: Diagnosis not present

## 2020-03-12 DIAGNOSIS — D692 Other nonthrombocytopenic purpura: Secondary | ICD-10-CM | POA: Diagnosis not present

## 2020-03-12 DIAGNOSIS — H33009 Unspecified retinal detachment with retinal break, unspecified eye: Secondary | ICD-10-CM | POA: Diagnosis not present

## 2020-03-12 DIAGNOSIS — I4821 Permanent atrial fibrillation: Secondary | ICD-10-CM | POA: Diagnosis not present

## 2020-03-12 DIAGNOSIS — M48061 Spinal stenosis, lumbar region without neurogenic claudication: Secondary | ICD-10-CM | POA: Diagnosis not present

## 2020-03-12 DIAGNOSIS — R7301 Impaired fasting glucose: Secondary | ICD-10-CM | POA: Diagnosis not present

## 2020-03-12 DIAGNOSIS — I8390 Asymptomatic varicose veins of unspecified lower extremity: Secondary | ICD-10-CM | POA: Diagnosis not present

## 2020-03-12 DIAGNOSIS — Z1212 Encounter for screening for malignant neoplasm of rectum: Secondary | ICD-10-CM | POA: Diagnosis not present

## 2020-03-12 DIAGNOSIS — Z0000 Encounter for general adult medical examination without abnormal findings: Secondary | ICD-10-CM | POA: Diagnosis not present

## 2020-03-12 DIAGNOSIS — R82998 Other abnormal findings in urine: Secondary | ICD-10-CM | POA: Diagnosis not present

## 2020-04-30 DIAGNOSIS — Z23 Encounter for immunization: Secondary | ICD-10-CM | POA: Diagnosis not present

## 2020-05-05 DIAGNOSIS — H35363 Drusen (degenerative) of macula, bilateral: Secondary | ICD-10-CM | POA: Diagnosis not present

## 2020-05-05 DIAGNOSIS — Z961 Presence of intraocular lens: Secondary | ICD-10-CM | POA: Diagnosis not present

## 2020-05-05 DIAGNOSIS — H35373 Puckering of macula, bilateral: Secondary | ICD-10-CM | POA: Diagnosis not present

## 2020-05-23 DIAGNOSIS — M1711 Unilateral primary osteoarthritis, right knee: Secondary | ICD-10-CM | POA: Diagnosis not present

## 2020-05-23 DIAGNOSIS — M25561 Pain in right knee: Secondary | ICD-10-CM | POA: Diagnosis not present

## 2020-05-25 ENCOUNTER — Ambulatory Visit (INDEPENDENT_AMBULATORY_CARE_PROVIDER_SITE_OTHER): Payer: Medicare Other | Admitting: Internal Medicine

## 2020-05-25 ENCOUNTER — Other Ambulatory Visit: Payer: Self-pay

## 2020-05-25 ENCOUNTER — Encounter: Payer: Self-pay | Admitting: Internal Medicine

## 2020-05-25 VITALS — BP 128/80 | HR 74 | Ht 70.0 in | Wt 180.8 lb

## 2020-05-25 DIAGNOSIS — I4821 Permanent atrial fibrillation: Secondary | ICD-10-CM | POA: Diagnosis not present

## 2020-05-25 NOTE — Patient Instructions (Addendum)
Medication Instructions:  Your physician recommends that you continue on your current medications as directed. Please refer to the Current Medication list given to you today.  *If you need a refill on your cardiac medications before your next appointment, please call your pharmacy*  Lab Work: None ordered.  If you have labs (blood work) drawn today and your tests are completely normal, you will receive your results only by: Marland Kitchen MyChart Message (if you have MyChart) OR . A paper copy in the mail If you have any lab test that is abnormal or we need to change your treatment, we will call you to review the results.  Testing/Procedures: please schedule ECHO and 1 year with Dr. Rayann Heman. Your physician has requested that you have an echocardiogram. Echocardiography is a painless test that uses sound waves to create images of your heart. It provides your doctor with information about the size and shape of your heart and how well your heart's chambers and valves are working. This procedure takes approximately one hour. There are no restrictions for this procedure.   Follow-Up: At [facility], you and your health needs are our priority.  As part of our continuing mission to provide you with exceptional heart care, we have created designated Provider Care Teams.  These Care Teams include your primary Cardiologist (physician) and Advanced Practice Providers (APPs -  Physician Assistants and Nurse Practitioners) who all work together to provide you with the care you need, when you need it.  We recommend signing up for the patient portal called "MyChart".  Sign up information is provided on this After Visit Summary.  MyChart is used to connect with patients for Virtual Visits (Telemedicine).  Patients are able to view lab/test results, encounter notes, upcoming appointments, etc.  Non-urgent messages can be sent to your provider as well.   To learn more about what you can do with MyChart, go to  [email or web address].    Your next appointment:   Your physician wants you to follow-up in: 1 year with Dr. Rayann Heman. You will receive a reminder letter in the mail two months in advance. If you don't receive a letter, please call our office to schedule the follow-up appointment.    Other Instructions:

## 2020-05-25 NOTE — Progress Notes (Signed)
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  Since last being seen in our clinic, the patient reports doing very well.  Today, he denies symptoms of palpitations, chest pain, shortness of breath,  lower extremity edema, dizziness, presyncope, or syncope.  The patient is otherwise without complaint today.   Past Medical History:  Diagnosis Date  . BPH (benign prostatic hyperplasia)   . Hyperlipidemia   . Hypertension   . Hypothyroidism   . Multinodular goiter   . Permanent atrial fibrillation ([place])   . PONV (postoperative nausea and vomiting)    eggs  . Tinnitus   . Wears glasses    Past Surgical History:  Procedure Laterality Date  . [place]   left  . CATARACT EXTRACTION  2004   both  . CHONDROPLASTY Left 05/13/2014   Procedure: CHONDROPLASTY;  Surgeon: Hessie Dibble, MD;  Location: [place];  Service: Orthopedics;  Laterality: Left;  . COLONOSCOPY    . KNEE ARTHROSCOPY WITH MEDIAL MENISECTOMY Left 05/13/2014   Procedure: KNEE ARTHROSCOPY WITH MEDIAL MENISECTOMY;  Surgeon: Hessie Dibble, MD;  Location: [place];  Service: Orthopedics;  Laterality: Left;  . TONSILLECTOMY AND ADENOIDECTOMY  1943   at age 1 and repeat at age 53    ROS- all systems are reviewed and negatives except as per HPI above  Current Outpatient Medications  Medication Sig Dispense Refill  . BIOTIN PO Take 1 tablet by mouth daily.    Marland Kitchen BYSTOLIC 5 MG tablet TAKE 1/2 TABLET BY MOUTH DAILY 45 tablet 2  . Cholecalciferol (VITAMIN D3) 2000 UNITS TABS Take 2,000 Units by mouth daily.     Marland Kitchen EPIPEN 2-PAK 0.3 MG/0.3ML SOAJ injection Inject 0.3 mg into the muscle daily as needed. Allergic reaction    . glucosamine-chondroitin 500-400 MG tablet Take 3 tablets by mouth daily.     Marland Kitchen levothyroxine (SYNTHROID, LEVOTHROID) 125 MCG tablet Take 125 mcg by  mouth daily.    . Multiple Vitamin (MULTIVITAMIN) tablet Take 1 tablet by mouth daily.    Marland Kitchen omega-3 acid ethyl esters (LOVAZA) 1 G capsule Take 2 g by mouth 2 (two) times daily.    . vitamin C (ASCORBIC ACID) 500 MG tablet Take 500 mg by mouth daily.    Alveda Reasons 20 MG TABS tablet TAKE 1 TABLET BY MOUTH DAILY WITH SUPPER 30 tablet 5   No current facility-administered medications for this visit.    Physical Exam: Vitals:   05/25/20 1059  BP: 128/80  Pulse: 74  SpO2: 97%  Weight: 180 lb 12.8 oz (82 kg)  Height: 5\' 10"  (1.778 m)    GEN- The patient is well appearing, alert and oriented x 3 today.   Head- normocephalic, atraumatic Eyes-  Sclera clear, conjunctiva pink Ears- hearing intact Oropharynx- clear Lungs- Clear to ausculation bilaterally, normal work of breathing Heart- irregular rate and rhythm, no murmurs, rubs or gallops, PMI not laterally displaced GI- soft, NT, ND, + BS Extremities- no clubbing, cyanosis, or edema  Wt Readings from Last 3 Encounters:  05/25/20 180 lb 12.8 oz (82 kg)  05/22/19 167 lb (75.8 kg)  04/23/19 177 lb (80.3 kg)    EKG tracing ordered today is personally reviewed and shows rate controlled afib  Assessment and Plan:  1. Permanent afib Rate controlled asymptmoatic chads2vasc score is 2.  He  is on xarelto Echo (last echo 2017 is reviewed)  Risks, benefits and potential toxicities for medications prescribed and/or refilled reviewed with patient today.   Return in a year  Thompson Grayer MD, [facility] 05/25/2020 11:04 AM

## 2020-05-28 DIAGNOSIS — R300 Dysuria: Secondary | ICD-10-CM | POA: Diagnosis not present

## 2020-06-15 ENCOUNTER — Other Ambulatory Visit: Payer: Self-pay

## 2020-06-15 ENCOUNTER — Ambulatory Visit (HOSPITAL_COMMUNITY): Payer: Medicare Other | Attending: Cardiology

## 2020-06-15 DIAGNOSIS — I4821 Permanent atrial fibrillation: Secondary | ICD-10-CM | POA: Diagnosis not present

## 2020-06-15 LAB — ECHOCARDIOGRAM COMPLETE
Area-P 1/2: 4.23 cm2
P 1/2 time: 482 msec
S' Lateral: 2.9 cm

## 2020-09-02 DIAGNOSIS — F4323 Adjustment disorder with mixed anxiety and depressed mood: Secondary | ICD-10-CM | POA: Diagnosis not present

## 2020-09-07 DIAGNOSIS — L570 Actinic keratosis: Secondary | ICD-10-CM | POA: Diagnosis not present

## 2020-09-07 DIAGNOSIS — L814 Other melanin hyperpigmentation: Secondary | ICD-10-CM | POA: Diagnosis not present

## 2020-09-07 DIAGNOSIS — L821 Other seborrheic keratosis: Secondary | ICD-10-CM | POA: Diagnosis not present

## 2020-09-07 DIAGNOSIS — D1801 Hemangioma of skin and subcutaneous tissue: Secondary | ICD-10-CM | POA: Diagnosis not present

## 2020-09-17 DIAGNOSIS — R7301 Impaired fasting glucose: Secondary | ICD-10-CM | POA: Diagnosis not present

## 2020-09-17 DIAGNOSIS — M179 Osteoarthritis of knee, unspecified: Secondary | ICD-10-CM | POA: Diagnosis not present

## 2020-09-17 DIAGNOSIS — M48061 Spinal stenosis, lumbar region without neurogenic claudication: Secondary | ICD-10-CM | POA: Diagnosis not present

## 2020-09-17 DIAGNOSIS — D692 Other nonthrombocytopenic purpura: Secondary | ICD-10-CM | POA: Diagnosis not present

## 2020-09-17 DIAGNOSIS — E039 Hypothyroidism, unspecified: Secondary | ICD-10-CM | POA: Diagnosis not present

## 2020-09-17 DIAGNOSIS — Z7901 Long term (current) use of anticoagulants: Secondary | ICD-10-CM | POA: Diagnosis not present

## 2020-09-17 DIAGNOSIS — H9319 Tinnitus, unspecified ear: Secondary | ICD-10-CM | POA: Diagnosis not present

## 2020-09-17 DIAGNOSIS — H33009 Unspecified retinal detachment with retinal break, unspecified eye: Secondary | ICD-10-CM | POA: Diagnosis not present

## 2020-09-17 DIAGNOSIS — N400 Enlarged prostate without lower urinary tract symptoms: Secondary | ICD-10-CM | POA: Diagnosis not present

## 2020-09-17 DIAGNOSIS — L439 Lichen planus, unspecified: Secondary | ICD-10-CM | POA: Diagnosis not present

## 2020-09-17 DIAGNOSIS — I8390 Asymptomatic varicose veins of unspecified lower extremity: Secondary | ICD-10-CM | POA: Diagnosis not present

## 2020-09-17 DIAGNOSIS — I4821 Permanent atrial fibrillation: Secondary | ICD-10-CM | POA: Diagnosis not present

## 2020-09-21 DIAGNOSIS — F4323 Adjustment disorder with mixed anxiety and depressed mood: Secondary | ICD-10-CM | POA: Diagnosis not present

## 2020-10-20 DIAGNOSIS — F4323 Adjustment disorder with mixed anxiety and depressed mood: Secondary | ICD-10-CM | POA: Diagnosis not present

## 2020-11-02 DIAGNOSIS — F4323 Adjustment disorder with mixed anxiety and depressed mood: Secondary | ICD-10-CM | POA: Diagnosis not present

## 2020-11-03 DIAGNOSIS — H35363 Drusen (degenerative) of macula, bilateral: Secondary | ICD-10-CM | POA: Diagnosis not present

## 2020-11-03 DIAGNOSIS — H35373 Puckering of macula, bilateral: Secondary | ICD-10-CM | POA: Diagnosis not present

## 2020-11-03 DIAGNOSIS — Z961 Presence of intraocular lens: Secondary | ICD-10-CM | POA: Diagnosis not present

## 2020-11-26 DIAGNOSIS — F4323 Adjustment disorder with mixed anxiety and depressed mood: Secondary | ICD-10-CM | POA: Diagnosis not present

## 2020-12-01 DIAGNOSIS — M48062 Spinal stenosis, lumbar region with neurogenic claudication: Secondary | ICD-10-CM | POA: Diagnosis not present

## 2021-01-01 DIAGNOSIS — M47816 Spondylosis without myelopathy or radiculopathy, lumbar region: Secondary | ICD-10-CM | POA: Diagnosis not present

## 2021-01-12 DIAGNOSIS — F4323 Adjustment disorder with mixed anxiety and depressed mood: Secondary | ICD-10-CM | POA: Diagnosis not present

## 2021-01-20 DIAGNOSIS — M47816 Spondylosis without myelopathy or radiculopathy, lumbar region: Secondary | ICD-10-CM | POA: Diagnosis not present

## 2021-02-08 DIAGNOSIS — F4323 Adjustment disorder with mixed anxiety and depressed mood: Secondary | ICD-10-CM | POA: Diagnosis not present

## 2021-02-09 DIAGNOSIS — M48062 Spinal stenosis, lumbar region with neurogenic claudication: Secondary | ICD-10-CM | POA: Diagnosis not present

## 2021-02-11 DIAGNOSIS — Z6824 Body mass index (BMI) 24.0-24.9, adult: Secondary | ICD-10-CM | POA: Diagnosis not present

## 2021-02-11 DIAGNOSIS — H9313 Tinnitus, bilateral: Secondary | ICD-10-CM | POA: Diagnosis not present

## 2021-02-11 DIAGNOSIS — Z87891 Personal history of nicotine dependence: Secondary | ICD-10-CM | POA: Diagnosis not present

## 2021-02-11 DIAGNOSIS — H903 Sensorineural hearing loss, bilateral: Secondary | ICD-10-CM | POA: Diagnosis not present

## 2021-02-11 DIAGNOSIS — H6121 Impacted cerumen, right ear: Secondary | ICD-10-CM | POA: Diagnosis not present

## 2021-02-11 DIAGNOSIS — H9201 Otalgia, right ear: Secondary | ICD-10-CM | POA: Diagnosis not present

## 2021-02-23 DIAGNOSIS — M48062 Spinal stenosis, lumbar region with neurogenic claudication: Secondary | ICD-10-CM | POA: Diagnosis not present

## 2021-02-26 DIAGNOSIS — H905 Unspecified sensorineural hearing loss: Secondary | ICD-10-CM | POA: Diagnosis not present

## 2021-02-26 DIAGNOSIS — H903 Sensorineural hearing loss, bilateral: Secondary | ICD-10-CM | POA: Diagnosis not present

## 2021-03-09 DIAGNOSIS — H93233 Hyperacusis, bilateral: Secondary | ICD-10-CM | POA: Diagnosis not present

## 2021-03-09 DIAGNOSIS — H9313 Tinnitus, bilateral: Secondary | ICD-10-CM | POA: Diagnosis not present

## 2021-03-09 DIAGNOSIS — H903 Sensorineural hearing loss, bilateral: Secondary | ICD-10-CM | POA: Diagnosis not present

## 2021-03-09 DIAGNOSIS — Z6824 Body mass index (BMI) 24.0-24.9, adult: Secondary | ICD-10-CM | POA: Diagnosis not present

## 2021-03-15 DIAGNOSIS — L84 Corns and callosities: Secondary | ICD-10-CM | POA: Diagnosis not present

## 2021-03-15 DIAGNOSIS — L821 Other seborrheic keratosis: Secondary | ICD-10-CM | POA: Diagnosis not present

## 2021-03-15 DIAGNOSIS — D485 Neoplasm of uncertain behavior of skin: Secondary | ICD-10-CM | POA: Diagnosis not present

## 2021-03-15 DIAGNOSIS — L814 Other melanin hyperpigmentation: Secondary | ICD-10-CM | POA: Diagnosis not present

## 2021-03-15 DIAGNOSIS — B353 Tinea pedis: Secondary | ICD-10-CM | POA: Diagnosis not present

## 2021-03-22 DIAGNOSIS — M48062 Spinal stenosis, lumbar region with neurogenic claudication: Secondary | ICD-10-CM | POA: Diagnosis not present

## 2021-03-26 DIAGNOSIS — Z125 Encounter for screening for malignant neoplasm of prostate: Secondary | ICD-10-CM | POA: Diagnosis not present

## 2021-03-26 DIAGNOSIS — R7301 Impaired fasting glucose: Secondary | ICD-10-CM | POA: Diagnosis not present

## 2021-03-26 DIAGNOSIS — R972 Elevated prostate specific antigen [PSA]: Secondary | ICD-10-CM | POA: Diagnosis not present

## 2021-03-26 DIAGNOSIS — E039 Hypothyroidism, unspecified: Secondary | ICD-10-CM | POA: Diagnosis not present

## 2021-03-30 DIAGNOSIS — M48061 Spinal stenosis, lumbar region without neurogenic claudication: Secondary | ICD-10-CM | POA: Diagnosis not present

## 2021-03-30 DIAGNOSIS — M5459 Other low back pain: Secondary | ICD-10-CM | POA: Diagnosis not present

## 2021-04-01 DIAGNOSIS — M48061 Spinal stenosis, lumbar region without neurogenic claudication: Secondary | ICD-10-CM | POA: Diagnosis not present

## 2021-04-01 DIAGNOSIS — M5459 Other low back pain: Secondary | ICD-10-CM | POA: Diagnosis not present

## 2021-04-02 DIAGNOSIS — Z1331 Encounter for screening for depression: Secondary | ICD-10-CM | POA: Diagnosis not present

## 2021-04-02 DIAGNOSIS — Z23 Encounter for immunization: Secondary | ICD-10-CM | POA: Diagnosis not present

## 2021-04-02 DIAGNOSIS — R82998 Other abnormal findings in urine: Secondary | ICD-10-CM | POA: Diagnosis not present

## 2021-04-02 DIAGNOSIS — Z1339 Encounter for screening examination for other mental health and behavioral disorders: Secondary | ICD-10-CM | POA: Diagnosis not present

## 2021-04-02 DIAGNOSIS — R972 Elevated prostate specific antigen [PSA]: Secondary | ICD-10-CM | POA: Diagnosis not present

## 2021-04-02 DIAGNOSIS — I8390 Asymptomatic varicose veins of unspecified lower extremity: Secondary | ICD-10-CM | POA: Diagnosis not present

## 2021-04-02 DIAGNOSIS — Z0000 Encounter for general adult medical examination without abnormal findings: Secondary | ICD-10-CM | POA: Diagnosis not present

## 2021-04-02 DIAGNOSIS — Z7901 Long term (current) use of anticoagulants: Secondary | ICD-10-CM | POA: Diagnosis not present

## 2021-04-02 DIAGNOSIS — E039 Hypothyroidism, unspecified: Secondary | ICD-10-CM | POA: Diagnosis not present

## 2021-04-02 DIAGNOSIS — H9319 Tinnitus, unspecified ear: Secondary | ICD-10-CM | POA: Diagnosis not present

## 2021-04-02 DIAGNOSIS — I4821 Permanent atrial fibrillation: Secondary | ICD-10-CM | POA: Diagnosis not present

## 2021-04-02 DIAGNOSIS — L439 Lichen planus, unspecified: Secondary | ICD-10-CM | POA: Diagnosis not present

## 2021-04-02 DIAGNOSIS — D692 Other nonthrombocytopenic purpura: Secondary | ICD-10-CM | POA: Diagnosis not present

## 2021-04-06 DIAGNOSIS — M48061 Spinal stenosis, lumbar region without neurogenic claudication: Secondary | ICD-10-CM | POA: Diagnosis not present

## 2021-04-06 DIAGNOSIS — M5459 Other low back pain: Secondary | ICD-10-CM | POA: Diagnosis not present

## 2021-04-07 DIAGNOSIS — Z1212 Encounter for screening for malignant neoplasm of rectum: Secondary | ICD-10-CM | POA: Diagnosis not present

## 2021-04-13 DIAGNOSIS — M5459 Other low back pain: Secondary | ICD-10-CM | POA: Diagnosis not present

## 2021-04-13 DIAGNOSIS — M48061 Spinal stenosis, lumbar region without neurogenic claudication: Secondary | ICD-10-CM | POA: Diagnosis not present

## 2021-04-15 DIAGNOSIS — M5459 Other low back pain: Secondary | ICD-10-CM | POA: Diagnosis not present

## 2021-04-15 DIAGNOSIS — M48061 Spinal stenosis, lumbar region without neurogenic claudication: Secondary | ICD-10-CM | POA: Diagnosis not present

## 2021-04-19 DIAGNOSIS — M5459 Other low back pain: Secondary | ICD-10-CM | POA: Diagnosis not present

## 2021-04-19 DIAGNOSIS — M48061 Spinal stenosis, lumbar region without neurogenic claudication: Secondary | ICD-10-CM | POA: Diagnosis not present

## 2021-04-23 DIAGNOSIS — M48061 Spinal stenosis, lumbar region without neurogenic claudication: Secondary | ICD-10-CM | POA: Diagnosis not present

## 2021-04-23 DIAGNOSIS — M5459 Other low back pain: Secondary | ICD-10-CM | POA: Diagnosis not present

## 2021-04-26 DIAGNOSIS — M48061 Spinal stenosis, lumbar region without neurogenic claudication: Secondary | ICD-10-CM | POA: Diagnosis not present

## 2021-04-26 DIAGNOSIS — M5459 Other low back pain: Secondary | ICD-10-CM | POA: Diagnosis not present

## 2021-04-30 DIAGNOSIS — M5459 Other low back pain: Secondary | ICD-10-CM | POA: Diagnosis not present

## 2021-04-30 DIAGNOSIS — M48061 Spinal stenosis, lumbar region without neurogenic claudication: Secondary | ICD-10-CM | POA: Diagnosis not present

## 2021-05-03 DIAGNOSIS — M48061 Spinal stenosis, lumbar region without neurogenic claudication: Secondary | ICD-10-CM | POA: Diagnosis not present

## 2021-05-03 DIAGNOSIS — M5459 Other low back pain: Secondary | ICD-10-CM | POA: Diagnosis not present

## 2021-05-05 DIAGNOSIS — M5459 Other low back pain: Secondary | ICD-10-CM | POA: Diagnosis not present

## 2021-05-05 DIAGNOSIS — M48061 Spinal stenosis, lumbar region without neurogenic claudication: Secondary | ICD-10-CM | POA: Diagnosis not present

## 2021-05-06 DIAGNOSIS — H35363 Drusen (degenerative) of macula, bilateral: Secondary | ICD-10-CM | POA: Diagnosis not present

## 2021-05-06 DIAGNOSIS — Z961 Presence of intraocular lens: Secondary | ICD-10-CM | POA: Diagnosis not present

## 2021-05-06 DIAGNOSIS — H35373 Puckering of macula, bilateral: Secondary | ICD-10-CM | POA: Diagnosis not present

## 2021-05-10 DIAGNOSIS — M5459 Other low back pain: Secondary | ICD-10-CM | POA: Diagnosis not present

## 2021-05-10 DIAGNOSIS — M48061 Spinal stenosis, lumbar region without neurogenic claudication: Secondary | ICD-10-CM | POA: Diagnosis not present

## 2021-05-12 DIAGNOSIS — M48061 Spinal stenosis, lumbar region without neurogenic claudication: Secondary | ICD-10-CM | POA: Diagnosis not present

## 2021-05-12 DIAGNOSIS — M5459 Other low back pain: Secondary | ICD-10-CM | POA: Diagnosis not present

## 2021-05-19 DIAGNOSIS — M5459 Other low back pain: Secondary | ICD-10-CM | POA: Diagnosis not present

## 2021-05-19 DIAGNOSIS — M48061 Spinal stenosis, lumbar region without neurogenic claudication: Secondary | ICD-10-CM | POA: Diagnosis not present

## 2021-05-21 DIAGNOSIS — M5459 Other low back pain: Secondary | ICD-10-CM | POA: Diagnosis not present

## 2021-05-21 DIAGNOSIS — M48061 Spinal stenosis, lumbar region without neurogenic claudication: Secondary | ICD-10-CM | POA: Diagnosis not present

## 2021-05-26 DIAGNOSIS — M5459 Other low back pain: Secondary | ICD-10-CM | POA: Diagnosis not present

## 2021-05-26 DIAGNOSIS — M48061 Spinal stenosis, lumbar region without neurogenic claudication: Secondary | ICD-10-CM | POA: Diagnosis not present

## 2021-05-28 DIAGNOSIS — M5459 Other low back pain: Secondary | ICD-10-CM | POA: Diagnosis not present

## 2021-05-28 DIAGNOSIS — M48061 Spinal stenosis, lumbar region without neurogenic claudication: Secondary | ICD-10-CM | POA: Diagnosis not present

## 2021-05-31 ENCOUNTER — Other Ambulatory Visit: Payer: Self-pay

## 2021-05-31 ENCOUNTER — Encounter: Payer: Self-pay | Admitting: Internal Medicine

## 2021-05-31 ENCOUNTER — Ambulatory Visit (INDEPENDENT_AMBULATORY_CARE_PROVIDER_SITE_OTHER): Payer: Medicare Other | Admitting: Internal Medicine

## 2021-05-31 VITALS — BP 138/80 | HR 70 | Resp 98 | Ht 70.0 in | Wt 179.2 lb

## 2021-05-31 DIAGNOSIS — I4821 Permanent atrial fibrillation: Secondary | ICD-10-CM | POA: Diagnosis not present

## 2021-05-31 NOTE — Progress Notes (Signed)
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  Since last being seen in our clinic, the patient reports doing very well.  His wife died this past year.  He continues to grieve.  He has struggled with hip and pelvic pain.  He is diligently doing physical therapy.  Today, he denies symptoms of palpitations, chest pain, shortness of breath,  lower extremity edema, dizziness, presyncope, or syncope.  The patient is otherwise without complaint today.   Past Medical History:  Diagnosis Date   BPH (benign prostatic hyperplasia)    Hyperlipidemia    Hypothyroidism    Multinodular goiter    Permanent atrial fibrillation (HCC)    PONV (postoperative nausea and vomiting)    eggs   Tinnitus    Wears glasses    Past Surgical History:  Procedure Laterality Date   ACHILLES TENDON REPAIR  1991   left   CATARACT EXTRACTION  2004   both   CHONDROPLASTY Left 05/13/2014   Procedure: CHONDROPLASTY;  Surgeon: Hessie Dibble, MD;  Location: [place];  Service: Orthopedics;  Laterality: Left;   COLONOSCOPY     KNEE ARTHROSCOPY WITH MEDIAL MENISECTOMY Left 05/13/2014   Procedure: KNEE ARTHROSCOPY WITH MEDIAL MENISECTOMY;  Surgeon: Hessie Dibble, MD;  Location: [place];  Service: Orthopedics;  Laterality: Left;   TONSILLECTOMY AND ADENOIDECTOMY  1943   at age 31 and repeat at age 15    ROS- all systems are reviewed and negatives except as per HPI above  Current Outpatient Medications  Medication Sig Dispense Refill   BIOTIN PO Take 1 tablet by mouth daily.     BYSTOLIC 5 MG tablet TAKE 1/2 TABLET BY MOUTH DAILY 45 tablet 2   Cholecalciferol (VITAMIN D3) 2000 UNITS TABS Take 2,000 Units by mouth daily.      EPIPEN 2-PAK 0.3 MG/0.3ML SOAJ injection Inject 0.3 mg into the muscle daily as needed. Allergic reaction     glucosamine-chondroitin 500-400 MG tablet Take  1 tablet by mouth daily.     levothyroxine (SYNTHROID, LEVOTHROID) 125 MCG tablet Take 125 mcg by mouth daily.     Multiple Vitamin (MULTIVITAMIN) tablet Take 1 tablet by mouth daily.     vitamin C (ASCORBIC ACID) 500 MG tablet Take 500 mg by mouth daily.     XARELTO 20 MG TABS tablet TAKE 1 TABLET BY MOUTH DAILY WITH SUPPER 30 tablet 5   omega-3 acid ethyl esters (LOVAZA) 1 G capsule Take 2 g by mouth 2 (two) times daily. (Patient not taking: Reported on 05/31/2021)     No current facility-administered medications for this visit.    Physical Exam: Vitals:   05/31/21 1005  BP: 138/80  Pulse: 70  Resp: (!) 98  Weight: 179 lb 3.2 oz (81.3 kg)  Height: 5\' 10"  (1.778 m)    GEN- The patient is well appearing, alert and oriented x 3 today.   Head- normocephalic, atraumatic Eyes-  Sclera clear, conjunctiva pink Ears- hearing intact Oropharynx- clear Lungs- Clear to ausculation bilaterally, normal work of breathing Heart- iRRR GI- soft, NT, ND, + BS Extremities- no clubbing, cyanosis, or edema,  L leg with moderate varicosities  Wt Readings from Last 3 Encounters:  05/31/21 179 lb 3.2 oz (81.3 kg)  05/25/20 180 lb 12.8 oz (82 kg)  05/22/19 167 lb (75.8 kg)   Echo 06/15/20- EF 55%,  moderate RV enlargement, severe biatrial enlargement,  EKG tracing ordered today is personally reviewed and shows afib, V rate 70 bpm  Assessment and Plan:  Permanent afib Rate controlled/ asymptomatic Chads2vasc score is 2.   Doing well with xarelto  Return to see EP APP in a year  Thompson Grayer MD, [facility] 05/31/2021 10:18 AM

## 2021-05-31 NOTE — Patient Instructions (Signed)

## 2021-06-04 DIAGNOSIS — M48061 Spinal stenosis, lumbar region without neurogenic claudication: Secondary | ICD-10-CM | POA: Diagnosis not present

## 2021-06-04 DIAGNOSIS — M5459 Other low back pain: Secondary | ICD-10-CM | POA: Diagnosis not present

## 2021-06-07 DIAGNOSIS — M48061 Spinal stenosis, lumbar region without neurogenic claudication: Secondary | ICD-10-CM | POA: Diagnosis not present

## 2021-06-07 DIAGNOSIS — M5459 Other low back pain: Secondary | ICD-10-CM | POA: Diagnosis not present

## 2021-06-14 DIAGNOSIS — M48061 Spinal stenosis, lumbar region without neurogenic claudication: Secondary | ICD-10-CM | POA: Diagnosis not present

## 2021-06-14 DIAGNOSIS — M5459 Other low back pain: Secondary | ICD-10-CM | POA: Diagnosis not present

## 2021-06-29 DIAGNOSIS — H6123 Impacted cerumen, bilateral: Secondary | ICD-10-CM | POA: Diagnosis not present

## 2021-06-29 DIAGNOSIS — H903 Sensorineural hearing loss, bilateral: Secondary | ICD-10-CM | POA: Diagnosis not present

## 2021-06-29 DIAGNOSIS — Z91040 Latex allergy status: Secondary | ICD-10-CM | POA: Diagnosis not present

## 2021-06-29 DIAGNOSIS — H9313 Tinnitus, bilateral: Secondary | ICD-10-CM | POA: Diagnosis not present

## 2021-07-05 DIAGNOSIS — M48061 Spinal stenosis, lumbar region without neurogenic claudication: Secondary | ICD-10-CM | POA: Diagnosis not present

## 2021-07-05 DIAGNOSIS — M5459 Other low back pain: Secondary | ICD-10-CM | POA: Diagnosis not present

## 2021-07-16 DIAGNOSIS — M5459 Other low back pain: Secondary | ICD-10-CM | POA: Diagnosis not present

## 2021-07-16 DIAGNOSIS — M48061 Spinal stenosis, lumbar region without neurogenic claudication: Secondary | ICD-10-CM | POA: Diagnosis not present

## 2021-07-29 DIAGNOSIS — M5459 Other low back pain: Secondary | ICD-10-CM | POA: Diagnosis not present

## 2021-07-29 DIAGNOSIS — M48061 Spinal stenosis, lumbar region without neurogenic claudication: Secondary | ICD-10-CM | POA: Diagnosis not present

## 2021-08-06 DIAGNOSIS — M1711 Unilateral primary osteoarthritis, right knee: Secondary | ICD-10-CM | POA: Diagnosis not present

## 2021-08-12 DIAGNOSIS — M5459 Other low back pain: Secondary | ICD-10-CM | POA: Diagnosis not present

## 2021-08-12 DIAGNOSIS — M48061 Spinal stenosis, lumbar region without neurogenic claudication: Secondary | ICD-10-CM | POA: Diagnosis not present

## 2021-08-26 DIAGNOSIS — M5459 Other low back pain: Secondary | ICD-10-CM | POA: Diagnosis not present

## 2021-08-26 DIAGNOSIS — M48061 Spinal stenosis, lumbar region without neurogenic claudication: Secondary | ICD-10-CM | POA: Diagnosis not present

## 2021-09-02 DIAGNOSIS — M5459 Other low back pain: Secondary | ICD-10-CM | POA: Diagnosis not present

## 2021-09-02 DIAGNOSIS — M48061 Spinal stenosis, lumbar region without neurogenic claudication: Secondary | ICD-10-CM | POA: Diagnosis not present

## 2021-09-03 DIAGNOSIS — M48062 Spinal stenosis, lumbar region with neurogenic claudication: Secondary | ICD-10-CM | POA: Diagnosis not present

## 2021-09-09 DIAGNOSIS — M48061 Spinal stenosis, lumbar region without neurogenic claudication: Secondary | ICD-10-CM | POA: Diagnosis not present

## 2021-09-09 DIAGNOSIS — M5459 Other low back pain: Secondary | ICD-10-CM | POA: Diagnosis not present

## 2021-09-15 DIAGNOSIS — L309 Dermatitis, unspecified: Secondary | ICD-10-CM | POA: Diagnosis not present

## 2021-09-15 DIAGNOSIS — B354 Tinea corporis: Secondary | ICD-10-CM | POA: Diagnosis not present

## 2021-09-15 DIAGNOSIS — D225 Melanocytic nevi of trunk: Secondary | ICD-10-CM | POA: Diagnosis not present

## 2021-09-15 DIAGNOSIS — L570 Actinic keratosis: Secondary | ICD-10-CM | POA: Diagnosis not present

## 2021-09-15 DIAGNOSIS — B351 Tinea unguium: Secondary | ICD-10-CM | POA: Diagnosis not present

## 2021-09-17 DIAGNOSIS — M48061 Spinal stenosis, lumbar region without neurogenic claudication: Secondary | ICD-10-CM | POA: Diagnosis not present

## 2021-09-17 DIAGNOSIS — M5459 Other low back pain: Secondary | ICD-10-CM | POA: Diagnosis not present

## 2021-09-20 DIAGNOSIS — M25561 Pain in right knee: Secondary | ICD-10-CM | POA: Diagnosis not present

## 2021-09-20 DIAGNOSIS — M1711 Unilateral primary osteoarthritis, right knee: Secondary | ICD-10-CM | POA: Diagnosis not present

## 2021-09-27 DIAGNOSIS — M1711 Unilateral primary osteoarthritis, right knee: Secondary | ICD-10-CM | POA: Diagnosis not present

## 2021-10-01 DIAGNOSIS — Z1331 Encounter for screening for depression: Secondary | ICD-10-CM | POA: Diagnosis not present

## 2021-10-01 DIAGNOSIS — Z7901 Long term (current) use of anticoagulants: Secondary | ICD-10-CM | POA: Diagnosis not present

## 2021-10-01 DIAGNOSIS — R972 Elevated prostate specific antigen [PSA]: Secondary | ICD-10-CM | POA: Diagnosis not present

## 2021-10-01 DIAGNOSIS — I4821 Permanent atrial fibrillation: Secondary | ICD-10-CM | POA: Diagnosis not present

## 2021-10-01 DIAGNOSIS — M5459 Other low back pain: Secondary | ICD-10-CM | POA: Diagnosis not present

## 2021-10-01 DIAGNOSIS — H9319 Tinnitus, unspecified ear: Secondary | ICD-10-CM | POA: Diagnosis not present

## 2021-10-01 DIAGNOSIS — M48061 Spinal stenosis, lumbar region without neurogenic claudication: Secondary | ICD-10-CM | POA: Diagnosis not present

## 2021-10-01 DIAGNOSIS — R7301 Impaired fasting glucose: Secondary | ICD-10-CM | POA: Diagnosis not present

## 2021-10-01 DIAGNOSIS — E039 Hypothyroidism, unspecified: Secondary | ICD-10-CM | POA: Diagnosis not present

## 2021-10-01 DIAGNOSIS — D692 Other nonthrombocytopenic purpura: Secondary | ICD-10-CM | POA: Diagnosis not present

## 2021-10-01 DIAGNOSIS — L439 Lichen planus, unspecified: Secondary | ICD-10-CM | POA: Diagnosis not present

## 2021-10-01 DIAGNOSIS — Z1339 Encounter for screening examination for other mental health and behavioral disorders: Secondary | ICD-10-CM | POA: Diagnosis not present

## 2021-10-15 DIAGNOSIS — M5459 Other low back pain: Secondary | ICD-10-CM | POA: Diagnosis not present

## 2021-10-15 DIAGNOSIS — M48061 Spinal stenosis, lumbar region without neurogenic claudication: Secondary | ICD-10-CM | POA: Diagnosis not present

## 2021-10-25 DIAGNOSIS — M5459 Other low back pain: Secondary | ICD-10-CM | POA: Diagnosis not present

## 2021-10-25 DIAGNOSIS — M48061 Spinal stenosis, lumbar region without neurogenic claudication: Secondary | ICD-10-CM | POA: Diagnosis not present

## 2021-11-08 DIAGNOSIS — M5459 Other low back pain: Secondary | ICD-10-CM | POA: Diagnosis not present

## 2021-11-08 DIAGNOSIS — M48061 Spinal stenosis, lumbar region without neurogenic claudication: Secondary | ICD-10-CM | POA: Diagnosis not present

## 2021-11-09 DIAGNOSIS — H35363 Drusen (degenerative) of macula, bilateral: Secondary | ICD-10-CM | POA: Diagnosis not present

## 2021-11-09 DIAGNOSIS — H01004 Unspecified blepharitis left upper eyelid: Secondary | ICD-10-CM | POA: Diagnosis not present

## 2021-11-09 DIAGNOSIS — H35373 Puckering of macula, bilateral: Secondary | ICD-10-CM | POA: Diagnosis not present

## 2021-11-09 DIAGNOSIS — Z961 Presence of intraocular lens: Secondary | ICD-10-CM | POA: Diagnosis not present

## 2021-11-09 DIAGNOSIS — H01001 Unspecified blepharitis right upper eyelid: Secondary | ICD-10-CM | POA: Diagnosis not present

## 2021-11-29 DIAGNOSIS — M5459 Other low back pain: Secondary | ICD-10-CM | POA: Diagnosis not present

## 2021-11-29 DIAGNOSIS — M48061 Spinal stenosis, lumbar region without neurogenic claudication: Secondary | ICD-10-CM | POA: Diagnosis not present

## 2021-12-09 DIAGNOSIS — B353 Tinea pedis: Secondary | ICD-10-CM | POA: Diagnosis not present

## 2021-12-24 DIAGNOSIS — M5459 Other low back pain: Secondary | ICD-10-CM | POA: Diagnosis not present

## 2021-12-24 DIAGNOSIS — M48061 Spinal stenosis, lumbar region without neurogenic claudication: Secondary | ICD-10-CM | POA: Diagnosis not present

## 2022-01-07 DIAGNOSIS — M5459 Other low back pain: Secondary | ICD-10-CM | POA: Diagnosis not present

## 2022-01-07 DIAGNOSIS — M48061 Spinal stenosis, lumbar region without neurogenic claudication: Secondary | ICD-10-CM | POA: Diagnosis not present

## 2022-02-04 DIAGNOSIS — M48061 Spinal stenosis, lumbar region without neurogenic claudication: Secondary | ICD-10-CM | POA: Diagnosis not present

## 2022-02-04 DIAGNOSIS — M5459 Other low back pain: Secondary | ICD-10-CM | POA: Diagnosis not present

## 2022-02-25 DIAGNOSIS — M5459 Other low back pain: Secondary | ICD-10-CM | POA: Diagnosis not present

## 2022-02-25 DIAGNOSIS — M48061 Spinal stenosis, lumbar region without neurogenic claudication: Secondary | ICD-10-CM | POA: Diagnosis not present

## 2022-03-17 DIAGNOSIS — D1801 Hemangioma of skin and subcutaneous tissue: Secondary | ICD-10-CM | POA: Diagnosis not present

## 2022-03-17 DIAGNOSIS — L570 Actinic keratosis: Secondary | ICD-10-CM | POA: Diagnosis not present

## 2022-03-17 DIAGNOSIS — D692 Other nonthrombocytopenic purpura: Secondary | ICD-10-CM | POA: Diagnosis not present

## 2022-03-17 DIAGNOSIS — L814 Other melanin hyperpigmentation: Secondary | ICD-10-CM | POA: Diagnosis not present

## 2022-03-17 DIAGNOSIS — L821 Other seborrheic keratosis: Secondary | ICD-10-CM | POA: Diagnosis not present

## 2022-03-17 DIAGNOSIS — D225 Melanocytic nevi of trunk: Secondary | ICD-10-CM | POA: Diagnosis not present

## 2022-03-25 DIAGNOSIS — M48061 Spinal stenosis, lumbar region without neurogenic claudication: Secondary | ICD-10-CM | POA: Diagnosis not present

## 2022-03-25 DIAGNOSIS — M5459 Other low back pain: Secondary | ICD-10-CM | POA: Diagnosis not present

## 2022-04-01 DIAGNOSIS — E039 Hypothyroidism, unspecified: Secondary | ICD-10-CM | POA: Diagnosis not present

## 2022-04-01 DIAGNOSIS — Z125 Encounter for screening for malignant neoplasm of prostate: Secondary | ICD-10-CM | POA: Diagnosis not present

## 2022-04-01 DIAGNOSIS — R7989 Other specified abnormal findings of blood chemistry: Secondary | ICD-10-CM | POA: Diagnosis not present

## 2022-04-01 DIAGNOSIS — R7301 Impaired fasting glucose: Secondary | ICD-10-CM | POA: Diagnosis not present

## 2022-04-12 DIAGNOSIS — I4821 Permanent atrial fibrillation: Secondary | ICD-10-CM | POA: Diagnosis not present

## 2022-04-12 DIAGNOSIS — R82998 Other abnormal findings in urine: Secondary | ICD-10-CM | POA: Diagnosis not present

## 2022-04-12 DIAGNOSIS — M179 Osteoarthritis of knee, unspecified: Secondary | ICD-10-CM | POA: Diagnosis not present

## 2022-04-12 DIAGNOSIS — Z0000 Encounter for general adult medical examination without abnormal findings: Secondary | ICD-10-CM | POA: Diagnosis not present

## 2022-04-12 DIAGNOSIS — I8390 Asymptomatic varicose veins of unspecified lower extremity: Secondary | ICD-10-CM | POA: Diagnosis not present

## 2022-04-12 DIAGNOSIS — R972 Elevated prostate specific antigen [PSA]: Secondary | ICD-10-CM | POA: Diagnosis not present

## 2022-04-12 DIAGNOSIS — Z1339 Encounter for screening examination for other mental health and behavioral disorders: Secondary | ICD-10-CM | POA: Diagnosis not present

## 2022-04-12 DIAGNOSIS — D692 Other nonthrombocytopenic purpura: Secondary | ICD-10-CM | POA: Diagnosis not present

## 2022-04-12 DIAGNOSIS — L439 Lichen planus, unspecified: Secondary | ICD-10-CM | POA: Diagnosis not present

## 2022-04-12 DIAGNOSIS — H33009 Unspecified retinal detachment with retinal break, unspecified eye: Secondary | ICD-10-CM | POA: Diagnosis not present

## 2022-04-12 DIAGNOSIS — M48061 Spinal stenosis, lumbar region without neurogenic claudication: Secondary | ICD-10-CM | POA: Diagnosis not present

## 2022-04-12 DIAGNOSIS — E039 Hypothyroidism, unspecified: Secondary | ICD-10-CM | POA: Diagnosis not present

## 2022-04-12 DIAGNOSIS — Z1331 Encounter for screening for depression: Secondary | ICD-10-CM | POA: Diagnosis not present

## 2022-04-12 DIAGNOSIS — Z7901 Long term (current) use of anticoagulants: Secondary | ICD-10-CM | POA: Diagnosis not present

## 2022-04-12 DIAGNOSIS — R7301 Impaired fasting glucose: Secondary | ICD-10-CM | POA: Diagnosis not present

## 2022-05-02 DIAGNOSIS — M48061 Spinal stenosis, lumbar region without neurogenic claudication: Secondary | ICD-10-CM | POA: Diagnosis not present

## 2022-05-02 DIAGNOSIS — M5459 Other low back pain: Secondary | ICD-10-CM | POA: Diagnosis not present

## 2022-05-23 DIAGNOSIS — Z23 Encounter for immunization: Secondary | ICD-10-CM | POA: Diagnosis not present

## 2022-05-31 DIAGNOSIS — H35363 Drusen (degenerative) of macula, bilateral: Secondary | ICD-10-CM | POA: Diagnosis not present

## 2022-05-31 DIAGNOSIS — H35373 Puckering of macula, bilateral: Secondary | ICD-10-CM | POA: Diagnosis not present

## 2022-05-31 DIAGNOSIS — Z961 Presence of intraocular lens: Secondary | ICD-10-CM | POA: Diagnosis not present

## 2022-06-20 DIAGNOSIS — M5459 Other low back pain: Secondary | ICD-10-CM | POA: Diagnosis not present

## 2022-06-20 DIAGNOSIS — M48061 Spinal stenosis, lumbar region without neurogenic claudication: Secondary | ICD-10-CM | POA: Diagnosis not present

## 2022-07-21 DIAGNOSIS — M5459 Other low back pain: Secondary | ICD-10-CM | POA: Diagnosis not present

## 2022-07-21 DIAGNOSIS — M48061 Spinal stenosis, lumbar region without neurogenic claudication: Secondary | ICD-10-CM | POA: Diagnosis not present

## 2022-07-27 DIAGNOSIS — M5459 Other low back pain: Secondary | ICD-10-CM | POA: Diagnosis not present

## 2022-07-27 DIAGNOSIS — M48061 Spinal stenosis, lumbar region without neurogenic claudication: Secondary | ICD-10-CM | POA: Diagnosis not present

## 2022-08-05 DIAGNOSIS — M13841 Other specified arthritis, right hand: Secondary | ICD-10-CM | POA: Diagnosis not present

## 2022-08-05 DIAGNOSIS — M48061 Spinal stenosis, lumbar region without neurogenic claudication: Secondary | ICD-10-CM | POA: Diagnosis not present

## 2022-08-05 DIAGNOSIS — M5459 Other low back pain: Secondary | ICD-10-CM | POA: Diagnosis not present

## 2022-08-05 DIAGNOSIS — M13842 Other specified arthritis, left hand: Secondary | ICD-10-CM | POA: Diagnosis not present

## 2022-08-11 DIAGNOSIS — M5459 Other low back pain: Secondary | ICD-10-CM | POA: Diagnosis not present

## 2022-08-11 DIAGNOSIS — M48061 Spinal stenosis, lumbar region without neurogenic claudication: Secondary | ICD-10-CM | POA: Diagnosis not present

## 2022-08-11 DIAGNOSIS — M13842 Other specified arthritis, left hand: Secondary | ICD-10-CM | POA: Diagnosis not present

## 2022-08-11 DIAGNOSIS — M13841 Other specified arthritis, right hand: Secondary | ICD-10-CM | POA: Diagnosis not present

## 2022-08-21 NOTE — Progress Notes (Unsigned)
Electrophysiology Office Follow up Visit Note:    Date:  08/21/2022   ID:  Jason, Ali [birth year], MRN [id number]  PCP:  Haywood Pao, MD  [facility] Cardiologist:  None  [facility] Electrophysiologist:  Vickie Epley, MD    Interval History:    Jason Ali is a 82 y.o. male who presents for a follow up visit.  Last seen 05/2021 by Allred. On xarelto. AF is permanent.       Past Medical History:  Diagnosis Date   BPH (benign prostatic hyperplasia)    Hyperlipidemia    Hypothyroidism    Multinodular goiter    Permanent atrial fibrillation (HCC)    PONV (postoperative nausea and vomiting)    eggs   Tinnitus    Wears glasses     Past Surgical History:  Procedure Laterality Date   ACHILLES TENDON REPAIR  1991   left   CATARACT EXTRACTION  2004   both   CHONDROPLASTY Left 05/13/2014   Procedure: CHONDROPLASTY;  Surgeon: Hessie Dibble, MD;  Location: [place];  Service: Orthopedics;  Laterality: Left;   COLONOSCOPY     KNEE ARTHROSCOPY WITH MEDIAL MENISECTOMY Left 05/13/2014   Procedure: KNEE ARTHROSCOPY WITH MEDIAL MENISECTOMY;  Surgeon: Hessie Dibble, MD;  Location: [place];  Service: Orthopedics;  Laterality: Left;   TONSILLECTOMY AND ADENOIDECTOMY  1943   at age 72 and repeat at age 68    Current Medications: No outpatient medications have been marked as taking for the 08/22/22 encounter (Appointment) with Vickie Epley, MD.     Allergies:   Bee venom, Egg white (egg protein), Eggs or egg-derived products, and Latex   Social History   Socioeconomic History   Marital status: Married    Spouse name: Not on file   Number of children: Not on file   Years of education: Not on file   Highest education level: Not on file  Occupational History   Not on file  Tobacco Use   Smoking status: Former    Types: Cigarettes    Quit date: 01/15/1965    Years since quitting: 49.6   Smokeless  tobacco: Never  Vaping Use   Vaping Use: Never used  Substance and Sexual Activity   Alcohol use: Yes    Alcohol/week: 4.0 standard drinks of alcohol    Types: 4 Glasses of wine per week    Comment: occasional red wine 3 to 4 x per week   Drug use: No   Sexual activity: Not Currently  Other Topics Concern   Not on file  Social History Narrative   Retired Clinical biochemist   Lives in [place]      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  ROS:   Please see the history of present illness.    All other systems reviewed and are negative.  EKGs/Labs/Other Studies Reviewed:    The following studies were reviewed today:    Recent Labs: No results found for requested labs within last 365 days.  Recent Lipid Panel No results found  for: "CHOL", "TRIG", "HDL", "CHOLHDL", "VLDL", "LDLCALC", "LDLDIRECT"  Physical Exam:    VS:  There were no vitals taken for this visit.    Wt Readings from Last 3 Encounters:  05/31/21 179 lb 3.2 oz (81.3 kg)  05/25/20 180 lb 12.8 oz (82 kg)  05/22/19 167 lb (75.8 kg)     GEN: *** Well nourished, well developed in no acute distress CARDIAC: ***irregularly irregular, no murmurs, rubs, gallops RESPIRATORY:  Clear to auscultation without rales, wheezing or rhonchi         ASSESSMENT:    No diagnosis found. PLAN:    In order of problems listed above:   #Permanent AF Rate controlled. On xarelto for stroke ppx.  Follow up 1 year with APP.    Medication Adjustments/Labs and Tests Ordered: Current medicines are reviewed at length with the patient today.  Concerns regarding  medicines are outlined above.  No orders of the defined types were placed in this encounter.  No orders of the defined types were placed in this encounter.    Signed, Lars Mage, MD, [facility], [facility] 08/21/2022 4:09 PM    Electrophysiology [facility]

## 2022-08-22 ENCOUNTER — Ambulatory Visit: Payer: Medicare Other | Attending: Cardiology | Admitting: Cardiology

## 2022-08-22 ENCOUNTER — Encounter: Payer: Self-pay | Admitting: Cardiology

## 2022-08-22 VITALS — BP 132/80 | HR 77 | Ht 70.0 in | Wt 181.0 lb

## 2022-08-22 DIAGNOSIS — I4891 Unspecified atrial fibrillation: Secondary | ICD-10-CM

## 2022-08-22 NOTE — Progress Notes (Signed)
Electrophysiology Office Follow up Visit Note:    Date:  08/22/2022   ID:  Jason, Ali 02-11-1941, MRN [id number]  PCP:  Haywood Pao, MD  [facility] Cardiologist:  None  [facility] Electrophysiologist:  Vickie Epley, MD    Interval History:    Jason Ali is a 82 y.o. male who presents for a follow up visit.  Last seen 05/2021 by Allred. On xarelto. AF is permanent.  Today, he reports that he is tolerating his Xarelto 20 MG with no abnormal bleeding problems and Nebivolol.  He denies any chest pain, shortness of breath, or peripheral edema. No lightheadedness, headaches, syncope, orthopnea, or PND.       Past Medical History:  Diagnosis Date   BPH (benign prostatic hyperplasia)    Hyperlipidemia    Hypothyroidism    Multinodular goiter    Permanent atrial fibrillation (HCC)    PONV (postoperative nausea and vomiting)    eggs   Tinnitus    Wears glasses     Past Surgical History:  Procedure Laterality Date   ACHILLES TENDON REPAIR  1991   left   CATARACT EXTRACTION  2004   both   CHONDROPLASTY Left 05/13/2014   Procedure: CHONDROPLASTY;  Surgeon: Hessie Dibble, MD;  Location: [place];  Service: Orthopedics;  Laterality: Left;   COLONOSCOPY     KNEE ARTHROSCOPY WITH MEDIAL MENISECTOMY Left 05/13/2014   Procedure: KNEE ARTHROSCOPY WITH MEDIAL MENISECTOMY;  Surgeon: Hessie Dibble, MD;  Location: [place];  Service: Orthopedics;  Laterality: Left;   TONSILLECTOMY AND ADENOIDECTOMY  1943   at age 65 and repeat at age 57    Current Medications: Current Meds  Medication Sig   BIOTIN PO Take 1 tablet by mouth daily.   BYSTOLIC 5 MG tablet TAKE 1/2 TABLET BY MOUTH DAILY   Cholecalciferol (VITAMIN D3) 2000 UNITS TABS Take 2,000 Units by mouth daily.    EPIPEN 2-PAK 0.3 MG/0.3ML SOAJ injection Inject 0.3 mg into the muscle daily as needed. Allergic reaction   glucosamine-chondroitin 500-400 MG  tablet Take 1 tablet by mouth daily.   levothyroxine (SYNTHROID, LEVOTHROID) 125 MCG tablet Take 125 mcg by mouth daily.   Multiple Vitamin (MULTIVITAMIN) tablet Take 1 tablet by mouth daily.   vitamin C (ASCORBIC ACID) 500 MG tablet Take 500 mg by mouth daily.   XARELTO 20 MG TABS tablet TAKE 1 TABLET BY MOUTH DAILY WITH SUPPER     Allergies:   Bee venom, Egg white (egg protein), Eggs or egg-derived products, and Latex   Social History   Socioeconomic History   Marital status: Married    Spouse name: Not on file   Number of children: Not on file   Years of education: Not on file   Highest education level: Not on file  Occupational History   Not on file  Tobacco Use   Smoking status: Former    Types: Cigarettes    Quit date: 01/15/1965    Years since quitting: 60.6   Smokeless tobacco: Never  Vaping Use   Vaping Use: Never used  Substance and Sexual Activity   Alcohol use: Yes    Alcohol/week: 4.0 standard drinks of alcohol    Types: 4 Glasses of wine per week    Comment: occasional red wine 3 to 4 x per week   Drug use: No   Sexual activity: Not Currently  Other Topics Concern   Not on file  Social History Narrative   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  ROS:   Please see the history of present illness.     All other systems reviewed and are negative.  EKGs/Labs/Other Studies Reviewed:    The following studies were reviewed today:    Recent Labs: No results found for requested labs within last 365 days.  Recent Lipid Panel No results found  for: "CHOL", "TRIG", "HDL", "CHOLHDL", "VLDL", "LDLCALC", "LDLDIRECT"  Physical Exam:    VS:  BP 132/80   Pulse 77   Ht '5\' 10"'$  (1.778 m)   Wt 181 lb (82.1 kg)   SpO2 99%   BMI 25.97 kg/m     Wt Readings from Last 3 Encounters:  08/22/22 181 lb (82.1 kg)  05/31/21 179 lb 3.2 oz (81.3 kg)  05/25/20 180 lb 12.8 oz (82 kg)     GEN:  Well nourished, well developed in no acute distress CARDIAC: irregularly irregular, no murmurs, rubs, gallops RESPIRATORY:  Clear to auscultation without rales, wheezing or rhonchi         ASSESSMENT:    1. Atrial fibrillation, unspecified type ([place])    PLAN:    In order of problems listed above:   #Permanent AF Rate controlled. On xarelto for stroke ppx.  Follow up 1 year with APP.    Medication Adjustments/Labs and Tests Ordered: Current medicines are reviewed at length with the patient today.  Concerns regarding medicines are outlined above.  No orders of the defined types were placed in this encounter.  No orders of the defined types were placed in this encounter.   I,Mitra Faeizi,acting as a Education administrator for Vickie Epley, MD.,have documented all relevant documentation on the behalf of Vickie Epley, MD,as directed by  Vickie Epley, MD while in the presence of Vickie Epley, MD.  I, Vickie Epley, MD, have reviewed all documentation for this visit. The documentation on 08/22/22 for the exam, diagnosis, procedures, and orders are all accurate and complete.   Signed, Lars Mage, MD, [facility], [facility] 08/22/2022 8:19 AM    Electrophysiology [facility]

## 2022-08-22 NOTE — Patient Instructions (Signed)
Medication Instructions:  Your physician recommends that you continue on your current medications as directed. Please refer to the Current Medication list given to you today.  *If you need a refill on your cardiac medications before your next appointment, please call your pharmacy*  Follow-Up: At [facility], you and your health needs are our priority.  As part of our continuing mission to provide you with exceptional heart care, we have created designated Provider Care Teams.  These Care Teams include your primary Cardiologist (physician) and Advanced Practice Providers (APPs -  Physician Assistants and Nurse Practitioners) who all work together to provide you with the care you need, when you need it.  Your next appointment:   1 year(s)  Provider:   You will see one of the following Advanced Practice Providers on your designated Care Team:   Renee Ursuy, PA-C Michael "Andy" Tillery, PA-C Suzann Riddle, NP  

## 2022-08-24 DIAGNOSIS — M65842 Other synovitis and tenosynovitis, left hand: Secondary | ICD-10-CM | POA: Diagnosis not present

## 2022-08-24 DIAGNOSIS — M79641 Pain in right hand: Secondary | ICD-10-CM | POA: Diagnosis not present

## 2022-08-24 DIAGNOSIS — M19041 Primary osteoarthritis, right hand: Secondary | ICD-10-CM | POA: Diagnosis not present

## 2022-08-24 DIAGNOSIS — M79642 Pain in left hand: Secondary | ICD-10-CM | POA: Diagnosis not present

## 2022-08-26 DIAGNOSIS — M48061 Spinal stenosis, lumbar region without neurogenic claudication: Secondary | ICD-10-CM | POA: Diagnosis not present

## 2022-08-26 DIAGNOSIS — M13842 Other specified arthritis, left hand: Secondary | ICD-10-CM | POA: Diagnosis not present

## 2022-08-26 DIAGNOSIS — M5459 Other low back pain: Secondary | ICD-10-CM | POA: Diagnosis not present

## 2022-08-26 DIAGNOSIS — M13841 Other specified arthritis, right hand: Secondary | ICD-10-CM | POA: Diagnosis not present

## 2022-09-08 DIAGNOSIS — M5459 Other low back pain: Secondary | ICD-10-CM | POA: Diagnosis not present

## 2022-09-08 DIAGNOSIS — M48061 Spinal stenosis, lumbar region without neurogenic claudication: Secondary | ICD-10-CM | POA: Diagnosis not present

## 2022-09-08 DIAGNOSIS — M13841 Other specified arthritis, right hand: Secondary | ICD-10-CM | POA: Diagnosis not present

## 2022-09-08 DIAGNOSIS — M13842 Other specified arthritis, left hand: Secondary | ICD-10-CM | POA: Diagnosis not present

## 2022-09-21 DIAGNOSIS — L814 Other melanin hyperpigmentation: Secondary | ICD-10-CM | POA: Diagnosis not present

## 2022-09-21 DIAGNOSIS — L570 Actinic keratosis: Secondary | ICD-10-CM | POA: Diagnosis not present

## 2022-09-21 DIAGNOSIS — Z85828 Personal history of other malignant neoplasm of skin: Secondary | ICD-10-CM | POA: Diagnosis not present

## 2022-09-21 DIAGNOSIS — D1801 Hemangioma of skin and subcutaneous tissue: Secondary | ICD-10-CM | POA: Diagnosis not present

## 2022-09-26 DIAGNOSIS — I8390 Asymptomatic varicose veins of unspecified lower extremity: Secondary | ICD-10-CM | POA: Diagnosis not present

## 2022-09-26 DIAGNOSIS — M48061 Spinal stenosis, lumbar region without neurogenic claudication: Secondary | ICD-10-CM | POA: Diagnosis not present

## 2022-09-26 DIAGNOSIS — R7301 Impaired fasting glucose: Secondary | ICD-10-CM | POA: Diagnosis not present

## 2022-09-26 DIAGNOSIS — H9319 Tinnitus, unspecified ear: Secondary | ICD-10-CM | POA: Diagnosis not present

## 2022-09-26 DIAGNOSIS — Z7901 Long term (current) use of anticoagulants: Secondary | ICD-10-CM | POA: Diagnosis not present

## 2022-09-26 DIAGNOSIS — I4821 Permanent atrial fibrillation: Secondary | ICD-10-CM | POA: Diagnosis not present

## 2022-09-26 DIAGNOSIS — M179 Osteoarthritis of knee, unspecified: Secondary | ICD-10-CM | POA: Diagnosis not present

## 2022-09-26 DIAGNOSIS — H33009 Unspecified retinal detachment with retinal break, unspecified eye: Secondary | ICD-10-CM | POA: Diagnosis not present

## 2022-09-26 DIAGNOSIS — E039 Hypothyroidism, unspecified: Secondary | ICD-10-CM | POA: Diagnosis not present

## 2022-09-26 DIAGNOSIS — R972 Elevated prostate specific antigen [PSA]: Secondary | ICD-10-CM | POA: Diagnosis not present

## 2022-09-26 DIAGNOSIS — L439 Lichen planus, unspecified: Secondary | ICD-10-CM | POA: Diagnosis not present

## 2022-10-06 DIAGNOSIS — M5459 Other low back pain: Secondary | ICD-10-CM | POA: Diagnosis not present

## 2022-10-06 DIAGNOSIS — M13842 Other specified arthritis, left hand: Secondary | ICD-10-CM | POA: Diagnosis not present

## 2022-10-06 DIAGNOSIS — M48061 Spinal stenosis, lumbar region without neurogenic claudication: Secondary | ICD-10-CM | POA: Diagnosis not present

## 2022-10-06 DIAGNOSIS — M13841 Other specified arthritis, right hand: Secondary | ICD-10-CM | POA: Diagnosis not present

## 2022-11-02 DIAGNOSIS — M5459 Other low back pain: Secondary | ICD-10-CM | POA: Diagnosis not present

## 2022-11-02 DIAGNOSIS — M13841 Other specified arthritis, right hand: Secondary | ICD-10-CM | POA: Diagnosis not present

## 2022-11-02 DIAGNOSIS — M48061 Spinal stenosis, lumbar region without neurogenic claudication: Secondary | ICD-10-CM | POA: Diagnosis not present

## 2022-11-02 DIAGNOSIS — M13842 Other specified arthritis, left hand: Secondary | ICD-10-CM | POA: Diagnosis not present

## 2022-12-02 DIAGNOSIS — M5459 Other low back pain: Secondary | ICD-10-CM | POA: Diagnosis not present

## 2022-12-02 DIAGNOSIS — R599 Enlarged lymph nodes, unspecified: Secondary | ICD-10-CM | POA: Diagnosis not present

## 2022-12-02 DIAGNOSIS — K1120 Sialoadenitis, unspecified: Secondary | ICD-10-CM | POA: Diagnosis not present

## 2022-12-02 DIAGNOSIS — M48061 Spinal stenosis, lumbar region without neurogenic claudication: Secondary | ICD-10-CM | POA: Diagnosis not present

## 2022-12-02 DIAGNOSIS — M13841 Other specified arthritis, right hand: Secondary | ICD-10-CM | POA: Diagnosis not present

## 2022-12-02 DIAGNOSIS — I4821 Permanent atrial fibrillation: Secondary | ICD-10-CM | POA: Diagnosis not present

## 2022-12-02 DIAGNOSIS — M13842 Other specified arthritis, left hand: Secondary | ICD-10-CM | POA: Diagnosis not present

## 2022-12-02 DIAGNOSIS — Z7901 Long term (current) use of anticoagulants: Secondary | ICD-10-CM | POA: Diagnosis not present

## 2022-12-28 DIAGNOSIS — M48061 Spinal stenosis, lumbar region without neurogenic claudication: Secondary | ICD-10-CM | POA: Diagnosis not present

## 2022-12-28 DIAGNOSIS — M13842 Other specified arthritis, left hand: Secondary | ICD-10-CM | POA: Diagnosis not present

## 2022-12-28 DIAGNOSIS — M13841 Other specified arthritis, right hand: Secondary | ICD-10-CM | POA: Diagnosis not present

## 2022-12-28 DIAGNOSIS — M5459 Other low back pain: Secondary | ICD-10-CM | POA: Diagnosis not present

## 2023-02-01 DIAGNOSIS — M5459 Other low back pain: Secondary | ICD-10-CM | POA: Diagnosis not present

## 2023-02-01 DIAGNOSIS — M13842 Other specified arthritis, left hand: Secondary | ICD-10-CM | POA: Diagnosis not present

## 2023-02-01 DIAGNOSIS — M13841 Other specified arthritis, right hand: Secondary | ICD-10-CM | POA: Diagnosis not present

## 2023-02-01 DIAGNOSIS — M48061 Spinal stenosis, lumbar region without neurogenic claudication: Secondary | ICD-10-CM | POA: Diagnosis not present

## 2023-03-15 DIAGNOSIS — M13841 Other specified arthritis, right hand: Secondary | ICD-10-CM | POA: Diagnosis not present

## 2023-03-15 DIAGNOSIS — M5459 Other low back pain: Secondary | ICD-10-CM | POA: Diagnosis not present

## 2023-03-15 DIAGNOSIS — M13842 Other specified arthritis, left hand: Secondary | ICD-10-CM | POA: Diagnosis not present

## 2023-03-15 DIAGNOSIS — M48061 Spinal stenosis, lumbar region without neurogenic claudication: Secondary | ICD-10-CM | POA: Diagnosis not present

## 2023-03-23 DIAGNOSIS — L570 Actinic keratosis: Secondary | ICD-10-CM | POA: Diagnosis not present

## 2023-03-23 DIAGNOSIS — L814 Other melanin hyperpigmentation: Secondary | ICD-10-CM | POA: Diagnosis not present

## 2023-03-23 DIAGNOSIS — B353 Tinea pedis: Secondary | ICD-10-CM | POA: Diagnosis not present

## 2023-03-23 DIAGNOSIS — I8391 Asymptomatic varicose veins of right lower extremity: Secondary | ICD-10-CM | POA: Diagnosis not present

## 2023-03-23 DIAGNOSIS — D1801 Hemangioma of skin and subcutaneous tissue: Secondary | ICD-10-CM | POA: Diagnosis not present

## 2023-03-23 DIAGNOSIS — L821 Other seborrheic keratosis: Secondary | ICD-10-CM | POA: Diagnosis not present

## 2023-03-23 DIAGNOSIS — I8392 Asymptomatic varicose veins of left lower extremity: Secondary | ICD-10-CM | POA: Diagnosis not present

## 2023-04-14 DIAGNOSIS — N400 Enlarged prostate without lower urinary tract symptoms: Secondary | ICD-10-CM | POA: Diagnosis not present

## 2023-04-14 DIAGNOSIS — R7989 Other specified abnormal findings of blood chemistry: Secondary | ICD-10-CM | POA: Diagnosis not present

## 2023-04-14 DIAGNOSIS — R972 Elevated prostate specific antigen [PSA]: Secondary | ICD-10-CM | POA: Diagnosis not present

## 2023-04-14 DIAGNOSIS — R7301 Impaired fasting glucose: Secondary | ICD-10-CM | POA: Diagnosis not present

## 2023-04-14 DIAGNOSIS — E039 Hypothyroidism, unspecified: Secondary | ICD-10-CM | POA: Diagnosis not present

## 2023-04-21 DIAGNOSIS — M48061 Spinal stenosis, lumbar region without neurogenic claudication: Secondary | ICD-10-CM | POA: Diagnosis not present

## 2023-04-21 DIAGNOSIS — Z23 Encounter for immunization: Secondary | ICD-10-CM | POA: Diagnosis not present

## 2023-04-21 DIAGNOSIS — Z1331 Encounter for screening for depression: Secondary | ICD-10-CM | POA: Diagnosis not present

## 2023-04-21 DIAGNOSIS — H9319 Tinnitus, unspecified ear: Secondary | ICD-10-CM | POA: Diagnosis not present

## 2023-04-21 DIAGNOSIS — M179 Osteoarthritis of knee, unspecified: Secondary | ICD-10-CM | POA: Diagnosis not present

## 2023-04-21 DIAGNOSIS — Z0000 Encounter for general adult medical examination without abnormal findings: Secondary | ICD-10-CM | POA: Diagnosis not present

## 2023-04-21 DIAGNOSIS — Z7901 Long term (current) use of anticoagulants: Secondary | ICD-10-CM | POA: Diagnosis not present

## 2023-04-21 DIAGNOSIS — I4821 Permanent atrial fibrillation: Secondary | ICD-10-CM | POA: Diagnosis not present

## 2023-04-21 DIAGNOSIS — E039 Hypothyroidism, unspecified: Secondary | ICD-10-CM | POA: Diagnosis not present

## 2023-04-21 DIAGNOSIS — Z1339 Encounter for screening examination for other mental health and behavioral disorders: Secondary | ICD-10-CM | POA: Diagnosis not present

## 2023-04-21 DIAGNOSIS — I8390 Asymptomatic varicose veins of unspecified lower extremity: Secondary | ICD-10-CM | POA: Diagnosis not present

## 2023-04-21 DIAGNOSIS — N400 Enlarged prostate without lower urinary tract symptoms: Secondary | ICD-10-CM | POA: Diagnosis not present

## 2023-04-21 DIAGNOSIS — D692 Other nonthrombocytopenic purpura: Secondary | ICD-10-CM | POA: Diagnosis not present

## 2023-04-21 DIAGNOSIS — R7301 Impaired fasting glucose: Secondary | ICD-10-CM | POA: Diagnosis not present

## 2023-04-21 DIAGNOSIS — R82998 Other abnormal findings in urine: Secondary | ICD-10-CM | POA: Diagnosis not present

## 2023-04-21 DIAGNOSIS — H33009 Unspecified retinal detachment with retinal break, unspecified eye: Secondary | ICD-10-CM | POA: Diagnosis not present

## 2023-04-25 DIAGNOSIS — Z23 Encounter for immunization: Secondary | ICD-10-CM | POA: Diagnosis not present

## 2023-04-28 DIAGNOSIS — M13841 Other specified arthritis, right hand: Secondary | ICD-10-CM | POA: Diagnosis not present

## 2023-04-28 DIAGNOSIS — M5459 Other low back pain: Secondary | ICD-10-CM | POA: Diagnosis not present

## 2023-04-28 DIAGNOSIS — M13842 Other specified arthritis, left hand: Secondary | ICD-10-CM | POA: Diagnosis not present

## 2023-04-28 DIAGNOSIS — M48061 Spinal stenosis, lumbar region without neurogenic claudication: Secondary | ICD-10-CM | POA: Diagnosis not present

## 2023-06-09 DIAGNOSIS — M13841 Other specified arthritis, right hand: Secondary | ICD-10-CM | POA: Diagnosis not present

## 2023-06-09 DIAGNOSIS — M48061 Spinal stenosis, lumbar region without neurogenic claudication: Secondary | ICD-10-CM | POA: Diagnosis not present

## 2023-06-09 DIAGNOSIS — M13842 Other specified arthritis, left hand: Secondary | ICD-10-CM | POA: Diagnosis not present

## 2023-06-09 DIAGNOSIS — M5459 Other low back pain: Secondary | ICD-10-CM | POA: Diagnosis not present

## 2023-06-27 DIAGNOSIS — H35373 Puckering of macula, bilateral: Secondary | ICD-10-CM | POA: Diagnosis not present

## 2023-06-27 DIAGNOSIS — Z961 Presence of intraocular lens: Secondary | ICD-10-CM | POA: Diagnosis not present

## 2023-06-27 DIAGNOSIS — H35363 Drusen (degenerative) of macula, bilateral: Secondary | ICD-10-CM | POA: Diagnosis not present

## 2023-07-10 DIAGNOSIS — J029 Acute pharyngitis, unspecified: Secondary | ICD-10-CM | POA: Diagnosis not present

## 2023-07-10 DIAGNOSIS — R5383 Other fatigue: Secondary | ICD-10-CM | POA: Diagnosis not present

## 2023-07-10 DIAGNOSIS — R0981 Nasal congestion: Secondary | ICD-10-CM | POA: Diagnosis not present

## 2023-07-10 DIAGNOSIS — I4821 Permanent atrial fibrillation: Secondary | ICD-10-CM | POA: Diagnosis not present

## 2023-07-10 DIAGNOSIS — R051 Acute cough: Secondary | ICD-10-CM | POA: Diagnosis not present

## 2023-07-10 DIAGNOSIS — J209 Acute bronchitis, unspecified: Secondary | ICD-10-CM | POA: Diagnosis not present

## 2023-07-10 DIAGNOSIS — Z1152 Encounter for screening for COVID-19: Secondary | ICD-10-CM | POA: Diagnosis not present

## 2023-07-12 DIAGNOSIS — M48061 Spinal stenosis, lumbar region without neurogenic claudication: Secondary | ICD-10-CM | POA: Diagnosis not present

## 2023-07-12 DIAGNOSIS — M5459 Other low back pain: Secondary | ICD-10-CM | POA: Diagnosis not present

## 2023-07-12 DIAGNOSIS — M13842 Other specified arthritis, left hand: Secondary | ICD-10-CM | POA: Diagnosis not present

## 2023-07-12 DIAGNOSIS — M13841 Other specified arthritis, right hand: Secondary | ICD-10-CM | POA: Diagnosis not present

## 2023-08-07 DIAGNOSIS — J0100 Acute maxillary sinusitis, unspecified: Secondary | ICD-10-CM | POA: Diagnosis not present

## 2023-08-07 DIAGNOSIS — I4821 Permanent atrial fibrillation: Secondary | ICD-10-CM | POA: Diagnosis not present

## 2023-08-25 ENCOUNTER — Ambulatory Visit: Payer: Medicare Other | Admitting: Student

## 2023-08-25 ENCOUNTER — Ambulatory Visit: Payer: Medicare Other | Admitting: Cardiology

## 2023-08-30 ENCOUNTER — Ambulatory Visit: Payer: Medicare Other | Admitting: Pulmonary Disease

## 2023-09-01 DIAGNOSIS — M5459 Other low back pain: Secondary | ICD-10-CM | POA: Diagnosis not present

## 2023-09-01 DIAGNOSIS — M13842 Other specified arthritis, left hand: Secondary | ICD-10-CM | POA: Diagnosis not present

## 2023-09-01 DIAGNOSIS — M13841 Other specified arthritis, right hand: Secondary | ICD-10-CM | POA: Diagnosis not present

## 2023-09-01 DIAGNOSIS — M48061 Spinal stenosis, lumbar region without neurogenic claudication: Secondary | ICD-10-CM | POA: Diagnosis not present

## 2023-09-11 NOTE — Progress Notes (Deleted)
  Cardiology Office Note:  .   Date:  09/11/2023  ID:  Jason Ali, DOB [birth year], MRN [id number] PCP: Gaspar Garbe, MD  [facility] Providers Cardiologist:  None Electrophysiologist:  Lanier Prude, MD {  History of Present Illness: .   Jason Ali is a 83 y.o. male w/PMHx of permanent AFib, hypothyroidism, HLD  Saw Dr. Lalla Brothers 08/22/22, doing well , unaware of his AFib, rate controlled, planned for an annual visit  Today's visit is scheduled as an annual visit  ROS:   *** xarelto, dose, bleeding, labs *** rates *** symptoms   Studies Reviewed: Marland Kitchen    EKG done today and reviewed by myself:  ***   06/15/2020: TTE  1. Left ventricular ejection fraction, by estimation, is 55 to 60%. The  left ventricle has normal function. The left ventricle has no regional  wall motion abnormalities. Left ventricular diastolic parameters were  normal.   2. Right ventricular systolic function is normal. The right ventricular  size is moderately enlarged. There is normal pulmonary artery systolic  pressure.   3. Left atrial size was severely dilated.   4. Right atrial size was severely dilated.   5. The mitral valve is normal in structure. Trivial mitral valve  regurgitation. No evidence of mitral stenosis.   6. Tricuspid valve regurgitation is mild to moderate.   7. The aortic valve is normal in structure. Aortic valve regurgitation is  trivial. Mild aortic valve sclerosis is present, with no evidence of  aortic valve stenosis. Aortic regurgitation PHT measures 482 msec.   8. Aortic dilatation noted. There is mild dilatation at the level of the  sinuses of Valsalva, measuring 43 mm. There is mild dilatation of the  ascending aorta, measuring 42 mm.   9. The inferior vena cava is normal in size with greater than 50%  respiratory variability, suggesting right atrial pressure of 3 mmHg.    Risk Assessment/Calculations:    Physical Exam:   VS:  There were no  vitals taken for this visit.   Wt Readings from Last 3 Encounters:  08/22/22 181 lb (82.1 kg)  05/31/21 179 lb 3.2 oz (81.3 kg)  05/25/20 180 lb 12.8 oz (82 kg)    GEN: Well nourished, well developed in no acute distress NECK: No JVD; No carotid bruits CARDIAC: ***irreg-irreg, no murmurs, rubs, gallops RESPIRATORY:  *** CTA b/l without rales, wheezing or rhonchi  ABDOMEN: Soft, non-tender, non-distended EXTREMITIES: *** No edema; No deformity    ASSESSMENT AND PLAN: .    permanent AFib CHA2DS2Vasc is 2, on Xarelto, *** appropriately dosed *** rates *** symptoms  Secondary hypercoagulable state 2/2 AFib     {Are you ordering a CV Procedure (e.g. stress test, cath, DCCV, TEE, etc)?   Press F2        :[id number]}     Dispo: ***  Signed, Sheilah Pigeon, PA-C

## 2023-09-14 ENCOUNTER — Ambulatory Visit: Payer: Medicare Other | Admitting: Physician Assistant

## 2023-09-14 NOTE — Progress Notes (Signed)
  Cardiology Office Note:  .   Date:  09/14/2023  ID:  Jason Ali, DOB February 03, 1941, MRN [id number] PCP: Gaspar Garbe, MD  [facility] Providers Cardiologist:  None Electrophysiologist:  Lanier Prude, MD {  History of Present Illness: .   Jason Ali is a 83 y.o. male w/PMHx of permanent AFib, hypothyroidism, HLD  Saw Dr. Lalla Brothers 08/22/22, doing well , unaware of his AFib, rate controlled, planned for an annual visit  Today's visit is scheduled as an annual visit  ROS:   He feels quite well No CP, palpitations or cardiac awareness Lives at [employer] exercising, pool, weights, and walking. Participating in a [place] exercise program currently Very active No SOB, DOE No bleeding or signs of bleeding  Reports his PMD does labs 2x/year   Studies Reviewed: Marland Kitchen    EKG done today and reviewed by myself:  Aib 59bpm   06/15/2020: TTE  1. Left ventricular ejection fraction, by estimation, is 55 to 60%. The  left ventricle has normal function. The left ventricle has no regional  wall motion abnormalities. Left ventricular diastolic parameters were  normal.   2. Right ventricular systolic function is normal. The right ventricular  size is moderately enlarged. There is normal pulmonary artery systolic  pressure.   3. Left atrial size was severely dilated.   4. Right atrial size was severely dilated.   5. The mitral valve is normal in structure. Trivial mitral valve  regurgitation. No evidence of mitral stenosis.   6. Tricuspid valve regurgitation is mild to moderate.   7. The aortic valve is normal in structure. Aortic valve regurgitation is  trivial. Mild aortic valve sclerosis is present, with no evidence of  aortic valve stenosis. Aortic regurgitation PHT measures 482 msec.   8. Aortic dilatation noted. There is mild dilatation at the level of the  sinuses of Valsalva, measuring 43 mm. There is mild dilatation of the  ascending aorta,  measuring 42 mm.   9. The inferior vena cava is normal in size with greater than 50%  respiratory variability, suggesting right atrial pressure of 3 mmHg.    Risk Assessment/Calculations:    Physical Exam:   VS:  There were no vitals taken for this visit.   Wt Readings from Last 3 Encounters:  08/22/22 181 lb (82.1 kg)  05/31/21 179 lb 3.2 oz (81.3 kg)  05/25/20 180 lb 12.8 oz (82 kg)    GEN: Well nourished, well developed in no acute distress NECK: No JVD; No carotid bruits CARDIAC: irreg-irreg, no murmurs, rubs, gallops RESPIRATORY:  CTA b/l without rales, wheezing or rhonchi  ABDOMEN: Soft, non-tender, non-distended EXTREMITIES: No edema; No deformity    ASSESSMENT AND PLAN: .    permanent AFib CHA2DS2Vasc is 2, on Xarelto, Will request his last labs from his PMD office controlled rates no symptoms  Secondary hypercoagulable state 2/2 AFib       Dispo: we will continue annual visits, he sees his PMD routinely, sooner if needed  Signed, Sheilah Pigeon, PA-C

## 2023-09-18 ENCOUNTER — Ambulatory Visit: Payer: Medicare Other | Attending: Physician Assistant | Admitting: Physician Assistant

## 2023-09-18 VITALS — BP 122/88 | HR 61 | Ht 70.0 in | Wt 182.0 lb

## 2023-09-18 DIAGNOSIS — I4821 Permanent atrial fibrillation: Secondary | ICD-10-CM | POA: Diagnosis not present

## 2023-09-18 DIAGNOSIS — D6869 Other thrombophilia: Secondary | ICD-10-CM | POA: Diagnosis not present

## 2023-09-18 NOTE — Patient Instructions (Signed)
 Medication Instructions:   Your physician recommends that you continue on your current medications as directed. Please refer to the Current Medication list given to you today.    *If you need a refill on your cardiac medications before your next appointment, please call your pharmacy*   Lab Work: NONE ORDERED  TODAY    If you have labs (blood work) drawn today and your tests are completely normal, you will receive your results only by: MyChart Message (if you have MyChart) OR A paper copy in the mail If you have any lab test that is abnormal or we need to change your treatment, we will call you to review the results.   Testing/Procedures:  NONE ORDERED  TODAY     Follow-Up: At [facility], you and your health needs are our priority.  As part of our continuing mission to provide you with exceptional heart care, we have created designated Provider Care Teams.  These Care Teams include your primary Cardiologist (physician) and Advanced Practice Providers (APPs -  Physician Assistants and Nurse Practitioners) who all work together to provide you with the care you need, when you need it.  We recommend signing up for the patient portal called "MyChart".  Sign up information is provided on this After Visit Summary.  MyChart is used to connect with patients for Virtual Visits (Telemedicine).  Patients are able to view lab/test results, encounter notes, upcoming appointments, etc.  Non-urgent messages can be sent to your provider as well.   To learn more about what you can do with MyChart, go to [email or web address].    Your next appointment:    1 year(s)  Provider:    You may see Lanier Prude, MD or one of the following Advanced Practice Providers on your designated Care Team:   Francis Dowse, [place] [address] [place], [place] Sherie Don, NP Canary Brim, NP   Other Instructions

## 2023-09-26 DIAGNOSIS — Z7901 Long term (current) use of anticoagulants: Secondary | ICD-10-CM | POA: Diagnosis not present

## 2023-09-26 DIAGNOSIS — I4821 Permanent atrial fibrillation: Secondary | ICD-10-CM | POA: Diagnosis not present

## 2023-09-26 DIAGNOSIS — I8390 Asymptomatic varicose veins of unspecified lower extremity: Secondary | ICD-10-CM | POA: Diagnosis not present

## 2023-09-26 DIAGNOSIS — R7301 Impaired fasting glucose: Secondary | ICD-10-CM | POA: Diagnosis not present

## 2023-09-26 DIAGNOSIS — M179 Osteoarthritis of knee, unspecified: Secondary | ICD-10-CM | POA: Diagnosis not present

## 2023-09-26 DIAGNOSIS — M48061 Spinal stenosis, lumbar region without neurogenic claudication: Secondary | ICD-10-CM | POA: Diagnosis not present

## 2023-09-26 DIAGNOSIS — H33009 Unspecified retinal detachment with retinal break, unspecified eye: Secondary | ICD-10-CM | POA: Diagnosis not present

## 2023-09-26 DIAGNOSIS — L439 Lichen planus, unspecified: Secondary | ICD-10-CM | POA: Diagnosis not present

## 2023-09-26 DIAGNOSIS — D692 Other nonthrombocytopenic purpura: Secondary | ICD-10-CM | POA: Diagnosis not present

## 2023-09-26 DIAGNOSIS — E039 Hypothyroidism, unspecified: Secondary | ICD-10-CM | POA: Diagnosis not present

## 2023-09-26 DIAGNOSIS — R972 Elevated prostate specific antigen [PSA]: Secondary | ICD-10-CM | POA: Diagnosis not present

## 2023-09-26 DIAGNOSIS — H9319 Tinnitus, unspecified ear: Secondary | ICD-10-CM | POA: Diagnosis not present

## 2023-10-03 DIAGNOSIS — L821 Other seborrheic keratosis: Secondary | ICD-10-CM | POA: Diagnosis not present

## 2023-10-03 DIAGNOSIS — B351 Tinea unguium: Secondary | ICD-10-CM | POA: Diagnosis not present

## 2023-10-03 DIAGNOSIS — L570 Actinic keratosis: Secondary | ICD-10-CM | POA: Diagnosis not present

## 2023-10-03 DIAGNOSIS — L814 Other melanin hyperpigmentation: Secondary | ICD-10-CM | POA: Diagnosis not present

## 2023-10-03 DIAGNOSIS — B354 Tinea corporis: Secondary | ICD-10-CM | POA: Diagnosis not present

## 2023-10-13 DIAGNOSIS — M48061 Spinal stenosis, lumbar region without neurogenic claudication: Secondary | ICD-10-CM | POA: Diagnosis not present

## 2023-10-13 DIAGNOSIS — M13841 Other specified arthritis, right hand: Secondary | ICD-10-CM | POA: Diagnosis not present

## 2023-10-13 DIAGNOSIS — M13842 Other specified arthritis, left hand: Secondary | ICD-10-CM | POA: Diagnosis not present

## 2023-10-13 DIAGNOSIS — M5459 Other low back pain: Secondary | ICD-10-CM | POA: Diagnosis not present

## 2023-11-03 DIAGNOSIS — M48061 Spinal stenosis, lumbar region without neurogenic claudication: Secondary | ICD-10-CM | POA: Diagnosis not present

## 2023-11-03 DIAGNOSIS — M5459 Other low back pain: Secondary | ICD-10-CM | POA: Diagnosis not present

## 2023-11-03 DIAGNOSIS — M13841 Other specified arthritis, right hand: Secondary | ICD-10-CM | POA: Diagnosis not present

## 2023-11-03 DIAGNOSIS — M13842 Other specified arthritis, left hand: Secondary | ICD-10-CM | POA: Diagnosis not present

## 2023-11-29 DIAGNOSIS — M13841 Other specified arthritis, right hand: Secondary | ICD-10-CM | POA: Diagnosis not present

## 2023-11-29 DIAGNOSIS — M48061 Spinal stenosis, lumbar region without neurogenic claudication: Secondary | ICD-10-CM | POA: Diagnosis not present

## 2023-11-29 DIAGNOSIS — M5459 Other low back pain: Secondary | ICD-10-CM | POA: Diagnosis not present

## 2023-11-29 DIAGNOSIS — M13842 Other specified arthritis, left hand: Secondary | ICD-10-CM | POA: Diagnosis not present

## 2023-12-19 DIAGNOSIS — M5459 Other low back pain: Secondary | ICD-10-CM | POA: Diagnosis not present

## 2023-12-19 DIAGNOSIS — M48061 Spinal stenosis, lumbar region without neurogenic claudication: Secondary | ICD-10-CM | POA: Diagnosis not present

## 2023-12-19 DIAGNOSIS — M13841 Other specified arthritis, right hand: Secondary | ICD-10-CM | POA: Diagnosis not present

## 2023-12-19 DIAGNOSIS — M13842 Other specified arthritis, left hand: Secondary | ICD-10-CM | POA: Diagnosis not present

## 2023-12-26 DIAGNOSIS — Z961 Presence of intraocular lens: Secondary | ICD-10-CM | POA: Diagnosis not present

## 2023-12-26 DIAGNOSIS — H35363 Drusen (degenerative) of macula, bilateral: Secondary | ICD-10-CM | POA: Diagnosis not present

## 2023-12-26 DIAGNOSIS — H35373 Puckering of macula, bilateral: Secondary | ICD-10-CM | POA: Diagnosis not present

## 2024-01-12 DIAGNOSIS — R21 Rash and other nonspecific skin eruption: Secondary | ICD-10-CM | POA: Diagnosis not present

## 2024-01-17 DIAGNOSIS — M13842 Other specified arthritis, left hand: Secondary | ICD-10-CM | POA: Diagnosis not present

## 2024-01-17 DIAGNOSIS — M13841 Other specified arthritis, right hand: Secondary | ICD-10-CM | POA: Diagnosis not present

## 2024-01-17 DIAGNOSIS — M5459 Other low back pain: Secondary | ICD-10-CM | POA: Diagnosis not present

## 2024-01-17 DIAGNOSIS — M48061 Spinal stenosis, lumbar region without neurogenic claudication: Secondary | ICD-10-CM | POA: Diagnosis not present

## 2024-02-16 DIAGNOSIS — M5459 Other low back pain: Secondary | ICD-10-CM | POA: Diagnosis not present

## 2024-02-16 DIAGNOSIS — M13841 Other specified arthritis, right hand: Secondary | ICD-10-CM | POA: Diagnosis not present

## 2024-02-16 DIAGNOSIS — M48061 Spinal stenosis, lumbar region without neurogenic claudication: Secondary | ICD-10-CM | POA: Diagnosis not present

## 2024-02-16 DIAGNOSIS — M13842 Other specified arthritis, left hand: Secondary | ICD-10-CM | POA: Diagnosis not present

## 2024-02-21 DIAGNOSIS — M48061 Spinal stenosis, lumbar region without neurogenic claudication: Secondary | ICD-10-CM | POA: Diagnosis not present

## 2024-02-21 DIAGNOSIS — M5459 Other low back pain: Secondary | ICD-10-CM | POA: Diagnosis not present

## 2024-02-21 DIAGNOSIS — M13842 Other specified arthritis, left hand: Secondary | ICD-10-CM | POA: Diagnosis not present

## 2024-02-21 DIAGNOSIS — M13841 Other specified arthritis, right hand: Secondary | ICD-10-CM | POA: Diagnosis not present

## 2024-02-26 DIAGNOSIS — M13842 Other specified arthritis, left hand: Secondary | ICD-10-CM | POA: Diagnosis not present

## 2024-02-26 DIAGNOSIS — M13841 Other specified arthritis, right hand: Secondary | ICD-10-CM | POA: Diagnosis not present

## 2024-02-26 DIAGNOSIS — M48061 Spinal stenosis, lumbar region without neurogenic claudication: Secondary | ICD-10-CM | POA: Diagnosis not present

## 2024-02-26 DIAGNOSIS — M5459 Other low back pain: Secondary | ICD-10-CM | POA: Diagnosis not present

## 2024-03-04 DIAGNOSIS — M13842 Other specified arthritis, left hand: Secondary | ICD-10-CM | POA: Diagnosis not present

## 2024-03-04 DIAGNOSIS — M13841 Other specified arthritis, right hand: Secondary | ICD-10-CM | POA: Diagnosis not present

## 2024-03-04 DIAGNOSIS — M5459 Other low back pain: Secondary | ICD-10-CM | POA: Diagnosis not present

## 2024-03-04 DIAGNOSIS — M48061 Spinal stenosis, lumbar region without neurogenic claudication: Secondary | ICD-10-CM | POA: Diagnosis not present

## 2024-03-18 DIAGNOSIS — M48061 Spinal stenosis, lumbar region without neurogenic claudication: Secondary | ICD-10-CM | POA: Diagnosis not present

## 2024-03-18 DIAGNOSIS — M13841 Other specified arthritis, right hand: Secondary | ICD-10-CM | POA: Diagnosis not present

## 2024-03-18 DIAGNOSIS — M13842 Other specified arthritis, left hand: Secondary | ICD-10-CM | POA: Diagnosis not present

## 2024-03-18 DIAGNOSIS — M5459 Other low back pain: Secondary | ICD-10-CM | POA: Diagnosis not present

## 2024-04-10 DIAGNOSIS — L570 Actinic keratosis: Secondary | ICD-10-CM | POA: Diagnosis not present

## 2024-04-10 DIAGNOSIS — L821 Other seborrheic keratosis: Secondary | ICD-10-CM | POA: Diagnosis not present

## 2024-04-10 DIAGNOSIS — D1801 Hemangioma of skin and subcutaneous tissue: Secondary | ICD-10-CM | POA: Diagnosis not present

## 2024-04-10 DIAGNOSIS — L814 Other melanin hyperpigmentation: Secondary | ICD-10-CM | POA: Diagnosis not present

## 2024-04-17 DIAGNOSIS — E039 Hypothyroidism, unspecified: Secondary | ICD-10-CM | POA: Diagnosis not present

## 2024-04-17 DIAGNOSIS — R7301 Impaired fasting glucose: Secondary | ICD-10-CM | POA: Diagnosis not present

## 2024-04-17 DIAGNOSIS — Z1212 Encounter for screening for malignant neoplasm of rectum: Secondary | ICD-10-CM | POA: Diagnosis not present

## 2024-04-17 DIAGNOSIS — Z7901 Long term (current) use of anticoagulants: Secondary | ICD-10-CM | POA: Diagnosis not present

## 2024-04-17 DIAGNOSIS — Z125 Encounter for screening for malignant neoplasm of prostate: Secondary | ICD-10-CM | POA: Diagnosis not present

## 2024-04-18 DIAGNOSIS — M13842 Other specified arthritis, left hand: Secondary | ICD-10-CM | POA: Diagnosis not present

## 2024-04-18 DIAGNOSIS — M5459 Other low back pain: Secondary | ICD-10-CM | POA: Diagnosis not present

## 2024-04-18 DIAGNOSIS — M13841 Other specified arthritis, right hand: Secondary | ICD-10-CM | POA: Diagnosis not present

## 2024-04-18 DIAGNOSIS — M48061 Spinal stenosis, lumbar region without neurogenic claudication: Secondary | ICD-10-CM | POA: Diagnosis not present

## 2024-04-24 DIAGNOSIS — I4821 Permanent atrial fibrillation: Secondary | ICD-10-CM | POA: Diagnosis not present

## 2024-04-24 DIAGNOSIS — Z0000 Encounter for general adult medical examination without abnormal findings: Secondary | ICD-10-CM | POA: Diagnosis not present

## 2024-04-24 DIAGNOSIS — H9319 Tinnitus, unspecified ear: Secondary | ICD-10-CM | POA: Diagnosis not present

## 2024-04-24 DIAGNOSIS — Z7901 Long term (current) use of anticoagulants: Secondary | ICD-10-CM | POA: Diagnosis not present

## 2024-04-24 DIAGNOSIS — Z1339 Encounter for screening examination for other mental health and behavioral disorders: Secondary | ICD-10-CM | POA: Diagnosis not present

## 2024-04-24 DIAGNOSIS — M48061 Spinal stenosis, lumbar region without neurogenic claudication: Secondary | ICD-10-CM | POA: Diagnosis not present

## 2024-04-24 DIAGNOSIS — Z1331 Encounter for screening for depression: Secondary | ICD-10-CM | POA: Diagnosis not present

## 2024-04-24 DIAGNOSIS — L439 Lichen planus, unspecified: Secondary | ICD-10-CM | POA: Diagnosis not present

## 2024-04-24 DIAGNOSIS — M179 Osteoarthritis of knee, unspecified: Secondary | ICD-10-CM | POA: Diagnosis not present

## 2024-04-24 DIAGNOSIS — H33009 Unspecified retinal detachment with retinal break, unspecified eye: Secondary | ICD-10-CM | POA: Diagnosis not present

## 2024-04-24 DIAGNOSIS — R82998 Other abnormal findings in urine: Secondary | ICD-10-CM | POA: Diagnosis not present

## 2024-04-24 DIAGNOSIS — R7301 Impaired fasting glucose: Secondary | ICD-10-CM | POA: Diagnosis not present

## 2024-04-24 DIAGNOSIS — R972 Elevated prostate specific antigen [PSA]: Secondary | ICD-10-CM | POA: Diagnosis not present

## 2024-04-24 DIAGNOSIS — Z23 Encounter for immunization: Secondary | ICD-10-CM | POA: Diagnosis not present

## 2024-04-24 DIAGNOSIS — E039 Hypothyroidism, unspecified: Secondary | ICD-10-CM | POA: Diagnosis not present

## 2024-04-24 DIAGNOSIS — N400 Enlarged prostate without lower urinary tract symptoms: Secondary | ICD-10-CM | POA: Diagnosis not present

## 2024-04-30 DIAGNOSIS — Z23 Encounter for immunization: Secondary | ICD-10-CM | POA: Diagnosis not present

## 2024-06-03 DIAGNOSIS — M13842 Other specified arthritis, left hand: Secondary | ICD-10-CM | POA: Diagnosis not present

## 2024-06-03 DIAGNOSIS — M13841 Other specified arthritis, right hand: Secondary | ICD-10-CM | POA: Diagnosis not present

## 2024-06-03 DIAGNOSIS — M5459 Other low back pain: Secondary | ICD-10-CM | POA: Diagnosis not present

## 2024-06-03 DIAGNOSIS — M48061 Spinal stenosis, lumbar region without neurogenic claudication: Secondary | ICD-10-CM | POA: Diagnosis not present

## 2024-06-26 DIAGNOSIS — Z961 Presence of intraocular lens: Secondary | ICD-10-CM | POA: Diagnosis not present

## 2024-06-26 DIAGNOSIS — H35363 Drusen (degenerative) of macula, bilateral: Secondary | ICD-10-CM | POA: Diagnosis not present

## 2024-06-26 DIAGNOSIS — H35373 Puckering of macula, bilateral: Secondary | ICD-10-CM | POA: Diagnosis not present

## 2024-10-14 ENCOUNTER — Ambulatory Visit: Admitting: Physician Assistant
